# Patient Record
Sex: Male | Born: 1970 | Race: White | Hispanic: No | Marital: Married | Smoking: Never smoker
Health system: Southern US, Community
[De-identification: ages and names within clinical notes are randomized; demographics above are authoritative.]

## PROBLEM LIST (undated history)

## (undated) DIAGNOSIS — R569 Unspecified convulsions: Secondary | ICD-10-CM

## (undated) HISTORY — PX: NO PAST SURGERIES: SHX2092

## (undated) HISTORY — DX: Unspecified convulsions: R56.9

---

## 1999-09-23 ENCOUNTER — Emergency Department (HOSPITAL_COMMUNITY): Admission: EM | Admit: 1999-09-23 | Discharge: 1999-09-23 | Payer: Self-pay | Admitting: Emergency Medicine

## 2012-12-21 ENCOUNTER — Other Ambulatory Visit: Payer: Self-pay | Admitting: Diagnostic Neuroimaging

## 2012-12-30 ENCOUNTER — Other Ambulatory Visit: Payer: Self-pay | Admitting: Diagnostic Neuroimaging

## 2012-12-31 LAB — COMPREHENSIVE METABOLIC PANEL
Albumin: 4.3 g/dL (ref 3.5–5.5)
Alkaline Phosphatase: 82 IU/L (ref 39–117)
BUN/Creatinine Ratio: 20 (ref 9–20)
BUN: 16 mg/dL (ref 6–24)
CO2: 28 mmol/L (ref 19–28)
Calcium: 9.3 mg/dL (ref 8.7–10.2)
Creatinine, Ser: 0.82 mg/dL (ref 0.76–1.27)
Globulin, Total: 2.2 g/dL (ref 1.5–4.5)
Glucose: 117 mg/dL — ABNORMAL HIGH (ref 65–99)
Total Protein: 6.5 g/dL (ref 6.0–8.5)

## 2012-12-31 LAB — CBC WITH DIFFERENTIAL
Basos: 0 % (ref 0–3)
Eosinophils Absolute: 0 10*3/uL (ref 0.0–0.4)
Hemoglobin: 14.8 g/dL (ref 12.6–17.7)
Lymphs: 36 % (ref 14–46)
MCHC: 34.9 g/dL (ref 31.5–35.7)
Neutrophils Relative %: 59 % (ref 40–74)
Platelets: 197 10*3/uL (ref 155–379)
RBC: 5.34 x10E6/uL (ref 4.14–5.80)

## 2013-01-06 ENCOUNTER — Telehealth: Payer: Self-pay | Admitting: *Deleted

## 2013-01-13 NOTE — Telephone Encounter (Signed)
Call pt with normal labs. Mildly elevated sugar noted. -VRP

## 2013-01-14 NOTE — Telephone Encounter (Signed)
Patient is aware of lab results.

## 2013-05-05 ENCOUNTER — Encounter: Payer: Self-pay | Admitting: Diagnostic Neuroimaging

## 2013-05-17 ENCOUNTER — Encounter: Payer: Self-pay | Admitting: Physician Assistant

## 2013-06-21 ENCOUNTER — Ambulatory Visit: Payer: Self-pay | Admitting: Diagnostic Neuroimaging

## 2013-07-27 ENCOUNTER — Ambulatory Visit (INDEPENDENT_AMBULATORY_CARE_PROVIDER_SITE_OTHER): Payer: 59 | Admitting: Diagnostic Neuroimaging

## 2013-07-27 ENCOUNTER — Encounter (INDEPENDENT_AMBULATORY_CARE_PROVIDER_SITE_OTHER): Payer: Self-pay

## 2013-07-27 ENCOUNTER — Encounter: Payer: Self-pay | Admitting: Diagnostic Neuroimaging

## 2013-07-27 VITALS — BP 132/90 | HR 91 | Temp 97.7°F | Ht 70.0 in | Wt 233.0 lb

## 2013-07-27 DIAGNOSIS — G40B09 Juvenile myoclonic epilepsy, not intractable, without status epilepticus: Secondary | ICD-10-CM

## 2013-07-27 DIAGNOSIS — G40309 Generalized idiopathic epilepsy and epileptic syndromes, not intractable, without status epilepticus: Secondary | ICD-10-CM

## 2013-07-27 MED ORDER — LEVETIRACETAM ER 500 MG PO TB24: 1000.0000 mg | ORAL_TABLET | Freq: Every day | ORAL | Status: DC

## 2013-07-27 NOTE — Patient Instructions (Signed)
Start levetiracetam 1000mg  at bedtime and continue Stavzor 1000mg  in the morning.  After 1 month, start tapering stavzor (reduce by 250mg  per day, every month):  750mg  daily x 1 month  500mg  daily x 1 month  250mg  daily x 1 month  Then stop stavzor.  Avoid driving during seizure medication transition.

## 2013-07-27 NOTE — Progress Notes (Signed)
[facility]  PATIENT: Steven Lamb DOB: [birth year]  REFERRING CLINICIAN:  HISTORY FROM: patient  REASON FOR VISIT: follow up   HISTORICAL  CHIEF COMPLAINT:  Chief Complaint  Patient presents with  . Follow-up    epilepsy    HISTORY OF PRESENT ILLNESS:   UPDATE 07/27/13: Since last visit patient has continued on stavzor and is doing well. No side effects. No seizures. He declined taking to get a good year as we discussed last time, because he felt he would rather pay extra money for medication and was doing well for him. Today patient is asking questions about levetiracetam, and wonders if this medication to work for him.  UPDATE 07/09/12: Doing well. No seizure since 2000 (when he was off meds). Now stavzor, and like the gel capsule. However co-pay assistance coupon has expired (now paying $55 per month). Looking for other options.   PRIOR HPI: 42 year old white male returns for followup. He was last seen in this office in November 2009 by Dr. Orlin Hilding. 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 He has difficulty following up on an annual basis. EEG in the past showed that he is generalized spike and wave activity in 1995. He has been maintained on Depakote and switched to [facility] when last seen by Dr. Orlin Hilding. He also has a history of migraines which has been well controlled. He has a history of minor fluctuations in his liver function but subsequent studies have been normal. He did not have his labs done after his last visit as requested. Last seizure event occurred in 2000, denies difficulty tolerating the medication. Discussion  about stopping the medication but the patient has elected to stay on it. No new neurologic symptoms.  REVIEW OF SYSTEMS: Full 14 system review of systems performed and notable only for as per HPI, otherwise negative.  ALLERGIES: No Known Allergies  HOME  MEDICATIONS: Outpatient Prescriptions Prior to Visit  Medication Sig Dispense Refill  . divalproex (DEPAKOTE ER) 500 MG 24 hr tablet Take 500 mg by mouth daily.      . Valproic Acid (STAVZOR) 500 MG CPDR Take 2 capsules (1,000 mg total) by mouth daily.  60 capsule  6   No facility-administered medications prior to visit.    PAST MEDICAL HISTORY: Past Medical History  Diagnosis Date  . Seizures     PAST SURGICAL HISTORY: History reviewed. No pertinent past surgical history.  FAMILY HISTORY: Family History  Problem Relation Age of Onset  . Heart disease Father   . Epilepsy Other     SOCIAL HISTORY:  History   Social History  . Marital Status: Married    Spouse Name: Boneta Lucks    Number of Children: 2  . Years of Education: masters   Occupational History  . Architectural technologist    Social History Main Topics  . Smoking status: Never Smoker   . Smokeless tobacco: Never Used  . Alcohol Use: No  . Drug Use: No  . Sexual Activity: Not on file   Other Topics Concern  . Not on file   Social History Narrative   Patient lives at home with family.   Caffeine Use: 1-2 cups of coffee; 12-20oz of soda daily     PHYSICAL EXAM  Filed Vitals:   07/27/13 0833  BP: 132/90  Pulse: 91  Temp: 97.7 F (36.5 C)  TempSrc: Oral  Height: 5\' 10"  (1.778 m)  Weight: 233 lb (  105.688 kg)    Not recorded    Body mass index is 33.43 kg/(m^2).  GENERAL EXAM: Patient is in no distress; well developed, nourished and groomed; neck is supple  CARDIOVASCULAR: Regular rate and rhythm, no murmurs, no carotid bruits  NEUROLOGIC: MENTAL STATUS: awake, alert, oriented to person, place and time, recent and remote memory intact, normal attention and concentration, language fluent, comprehension intact, naming intact, fund of knowledge appropriate CRANIAL NERVE: no papilledema on fundoscopic exam, pupils equal and reactive to light, visual fields full to confrontation, extraocular muscles  intact, no nystagmus, facial sensation and strength symmetric, hearing intact, palate elevates symmetrically, uvula midline, shoulder shrug symmetric, tongue midline. MOTOR: normal bulk and tone, full strength in the BUE, BLE SENSORY: normal and symmetric to light touch COORDINATION: finger-nose-finger, fine finger movements normal GAIT/STATION: narrow based gait; romberg is negative    DIAGNOSTIC DATA (LABS, IMAGING, TESTING) - I reviewed patient records, labs, notes, testing and imaging myself where available.  Lab Results  Component Value Date   WBC 7.7 12/30/2012   HGB 14.8 12/30/2012   HCT 42.4 12/30/2012   MCV 79 12/30/2012   PLT 197 12/30/2012      Component Value Date/Time   NA 139 12/30/2012 1424   K 4.1 12/30/2012 1424   CL 103 12/30/2012 1424   CO2 28 12/30/2012 1424   GLUCOSE 117* 12/30/2012 1424   BUN 16 12/30/2012 1424   CREATININE 0.82 12/30/2012 1424   CALCIUM 9.3 12/30/2012 1424   PROT 6.5 12/30/2012 1424   AST 21 12/30/2012 1424   ALT 27 12/30/2012 1424   ALKPHOS 82 12/30/2012 1424   BILITOT 0.4 12/30/2012 1424   GFRNONAA 111 12/30/2012 1424   GFRAA 128 12/30/2012 1424   No results found for this basename: CHOL, HDL, LDLCALC, LDLDIRECT, TRIG, CHOLHDL   No results found for this basename: HGBA1C   No results found for this basename: VITAMINB12   No results found for this basename: TSH     ASSESSMENT AND PLAN  42 y.o. year old male here with Juvenile myoclonic epilepsy since 1995, doing well. Patient requests transition to new generation antiseizure medication with less side effects and better cost than his brand name Stavzor.  PLAN: 1. Start levetiracetam 1000mg  at bedtime and continue Stavzor 1000mg  in the morning.  2. After 1 month, start tapering stavzor (reduce by 250mg  per day, every month):  750mg  daily x 1 month  500mg  daily x 1 month  250mg  daily x 1 month  Then stop stavzor.  3. Avoid driving during seizure medication transition.  Meds ordered  this encounter  Medications  . Valproic Acid (STAVZOR) 250 MG CPDR    Sig: Take 1,000 mg by mouth daily. 4 capsules daily  . levETIRAcetam (KEPPRA XR) 500 MG 24 hr tablet    Sig: Take 2 tablets (1,000 mg total) by mouth at bedtime.    Dispense:  60 tablet    Refill:  6   Return in about 2 months (around 09/27/2013).   Suanne Marker, MD 07/27/2013, 9:08 AM Certified in Neurology, Neurophysiology and Neuroimaging  [facility] [address] [place], [place] [address] [phone]

## 2013-08-02 ENCOUNTER — Telehealth: Payer: Self-pay

## 2013-08-02 NOTE — Telephone Encounter (Signed)
[facility] sent [place] a fax saying Steven Lamb is currently on Backorder, with no release date.  They do not know when they will be able to get this medication back in stock.  I see in the chart notes the patient will be tapering this medication, however, in the meantime they are requesting a drug change.  Please advise.  Thank you.

## 2013-08-30 ENCOUNTER — Telehealth: Payer: Self-pay | Admitting: Diagnostic Neuroimaging

## 2013-08-30 NOTE — Telephone Encounter (Signed)
Error

## 2013-08-30 NOTE — Telephone Encounter (Signed)
The patient can be reached at [phone].

## 2013-08-30 NOTE — Telephone Encounter (Signed)
Patient calling stating that his pharmacy has told him that Nicholas Lose has been discontinued and is wondering what the next step is.

## 2013-09-02 NOTE — Telephone Encounter (Signed)
I called patient at both numbers, no answer. I called pharmacy and called in the change:   Will switch stavzor to divalproex 250mg  tabs (take 4 days daily and reduce according to tapering schedule). Plan to continue levetriacetam and taper divalproex off. See last progress note.   Penni Bombard, MD 4/66/5993, 57:01 AM Certified in Neurology, Neurophysiology and Neuroimaging  [facility] [address], [place], [place] [address] [phone]

## 2013-09-03 ENCOUNTER — Encounter: Payer: Self-pay | Admitting: Diagnostic Neuroimaging

## 2013-10-13 ENCOUNTER — Ambulatory Visit: Payer: 59 | Admitting: Diagnostic Neuroimaging

## 2013-12-03 ENCOUNTER — Ambulatory Visit (INDEPENDENT_AMBULATORY_CARE_PROVIDER_SITE_OTHER): Payer: 59 | Admitting: Diagnostic Neuroimaging

## 2013-12-03 ENCOUNTER — Encounter: Payer: Self-pay | Admitting: Diagnostic Neuroimaging

## 2013-12-03 VITALS — BP 132/88 | HR 79 | Ht 70.0 in | Wt 239.0 lb

## 2013-12-03 DIAGNOSIS — G40309 Generalized idiopathic epilepsy and epileptic syndromes, not intractable, without status epilepticus: Secondary | ICD-10-CM

## 2013-12-03 DIAGNOSIS — G40B09 Juvenile myoclonic epilepsy, not intractable, without status epilepticus: Secondary | ICD-10-CM

## 2013-12-03 MED ORDER — LEVETIRACETAM ER 500 MG PO TB24: 1000.0000 mg | ORAL_TABLET | Freq: Every day | ORAL | Status: DC

## 2013-12-03 NOTE — Patient Instructions (Signed)
Continue levetiracetam XR 1000mg  at bedtime.  Continue tapering depakote off as per plan.

## 2013-12-03 NOTE — Progress Notes (Signed)
[facility]  PATIENT: Steven Lamb DOB: September 05, 1970  REFERRING CLINICIAN:  HISTORY FROM: patient  REASON FOR VISIT: follow up   HISTORICAL  CHIEF COMPLAINT:  Chief Complaint  Patient presents with  . Follow-up    jme    HISTORY OF PRESENT ILLNESS:   UPDATE 12/03/13: Since last visit, no seizures. Has started LEV XR 1000mg  qhs, and then stavzor switched to depakote (pharmacy stopped carrying it). Initially had sig weight gain and mood swings. Now as he has reduced depakote, his mood and weight have improved.  UPDATE 07/27/13: Since last visit patient has continued on stavzor and is doing well. No side effects. No seizures. He declined taking to get a good year as we discussed last time, because he felt he would rather pay extra money for medication and was doing well for him. Today patient is asking questions about levetiracetam, and wonders if this medication to work for him.  UPDATE 07/09/12: Doing well. No seizure since 2000 (when he was off meds). Now stavzor, and like the gel capsule. However co-pay assistance coupon has expired (now paying $55 per month). Looking for other options.   PRIOR HPI: 43 year old white male returns for followup. He was last seen in this office in November 2009 by Dr. Jacolyn Reedy. 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 He has difficulty following up on an annual basis. EEG in the past showed that he is generalized spike and wave activity in 1995. He has been maintained on Depakote and switched to [facility] when last seen by Dr. Jacolyn Reedy. He also has a history of migraines which has been well controlled. He has a history of minor fluctuations in his liver function but subsequent studies have been normal. He did not have his labs done after his last visit as requested. Last seizure event occurred in 2000, denies difficulty tolerating the medication. Discussion  about stopping the  medication but the patient has elected to stay on it. No new neurologic symptoms.  REVIEW OF SYSTEMS: Full 14 system review of systems performed and notable only for as per HPI, otherwise negative.  ALLERGIES: No Known Allergies  HOME MEDICATIONS: Outpatient Prescriptions Prior to Visit  Medication Sig Dispense Refill  . levETIRAcetam (KEPPRA XR) 500 MG 24 hr tablet Take 2 tablets (1,000 mg total) by mouth at bedtime.  60 tablet  6  . Valproic Acid (STAVZOR) 250 MG CPDR Take 250 mg by mouth daily.        No facility-administered medications prior to visit.    PAST MEDICAL HISTORY: Past Medical History  Diagnosis Date  . Seizures     PAST SURGICAL HISTORY: History reviewed. No pertinent past surgical history.  FAMILY HISTORY: Family History  Problem Relation Age of Onset  . Heart disease Father   . Epilepsy Other     SOCIAL HISTORY:  History   Social History  . Marital Status: Married    Spouse Name: [name]    Number of Children: 2  . Years of Education: masters   Occupational History  . Advertising copywriter    Social History Main Topics  . Smoking status: Never Smoker   . Smokeless tobacco: Never Used  . Alcohol Use: No  . Drug Use: No  . Sexual Activity: Not on file   Other Topics Concern  . Not on file   Social History Narrative   Patient lives at home with family.   Caffeine Use:  1-2 cups of coffee; 12-20oz of soda daily     PHYSICAL EXAM  Filed Vitals:   12/03/13 0838  BP: 132/88  Pulse: 79  Height: 5\' 10"  (1.778 m)  Weight: 239 lb (108.41 kg)    Not recorded    Body mass index is 34.29 kg/(m^2).  GENERAL EXAM: Patient is in no distress; well developed, nourished and groomed; neck is supple  CARDIOVASCULAR: Regular rate and rhythm, no murmurs, no carotid bruits  NEUROLOGIC: MENTAL STATUS: awake, alert, oriented to person, place and time, recent and remote memory intact, normal attention and concentration, language fluent,  comprehension intact, naming intact, fund of knowledge appropriate CRANIAL NERVE: no papilledema on fundoscopic exam, pupils equal and reactive to light, visual fields full to confrontation, extraocular muscles intact, no nystagmus, facial sensation and strength symmetric, hearing intact, palate elevates symmetrically, uvula midline, shoulder shrug symmetric, tongue midline. MOTOR: normal bulk and tone, full strength in the BUE, BLE SENSORY: normal and symmetric to light touch COORDINATION: finger-nose-finger, fine finger movements normal GAIT/STATION: narrow based gait; romberg is negative; tandem stable    DIAGNOSTIC DATA (LABS, IMAGING, TESTING) - I reviewed patient records, labs, notes, testing and imaging myself where available.  Lab Results  Component Value Date   WBC 7.7 12/30/2012   HGB 14.8 12/30/2012   HCT 42.4 12/30/2012   MCV 79 12/30/2012   PLT 197 12/30/2012      Component Value Date/Time   NA 139 12/30/2012 1424   K 4.1 12/30/2012 1424   CL 103 12/30/2012 1424   CO2 28 12/30/2012 1424   GLUCOSE 117* 12/30/2012 1424   BUN 16 12/30/2012 1424   CREATININE 0.82 12/30/2012 1424   CALCIUM 9.3 12/30/2012 1424   PROT 6.5 12/30/2012 1424   AST 21 12/30/2012 1424   ALT 27 12/30/2012 1424   ALKPHOS 82 12/30/2012 1424   BILITOT 0.4 12/30/2012 1424   GFRNONAA 111 12/30/2012 1424   GFRAA 128 12/30/2012 1424   No results found for this basename: CHOL,  HDL,  LDLCALC,  LDLDIRECT,  TRIG,  CHOLHDL   No results found for this basename: HGBA1C   No results found for this basename: VITAMINB12   No results found for this basename: TSH     ASSESSMENT AND PLAN  43 y.o. year old male here with Juvenile myoclonic epilepsy since 1995, doing well. Patient transitioning to new generation antiseizure medication with less side effects and better cost than his prior brand name Stavzor.  PLAN: 1. Continue levetiracetam XR 1000mg  at bedtime 2. Continue depakote 250mg  daily x 1 month, then stop 3.  After off depakote x 1 month, may resume driving  Return in about 6 months (around 06/04/2014).   Penni Bombard, MD 04/01/4817, 5:63 AM Certified in Neurology, Neurophysiology and Neuroimaging  [facility] [address] [place], [place] [address] [phone]

## 2014-06-06 ENCOUNTER — Encounter: Payer: Self-pay | Admitting: Diagnostic Neuroimaging

## 2014-06-06 ENCOUNTER — Ambulatory Visit (INDEPENDENT_AMBULATORY_CARE_PROVIDER_SITE_OTHER): Payer: 59 | Admitting: Diagnostic Neuroimaging

## 2014-06-06 VITALS — BP 136/88 | HR 90 | Temp 97.2°F | Ht 70.0 in | Wt 231.4 lb

## 2014-06-06 DIAGNOSIS — G40B09 Juvenile myoclonic epilepsy, not intractable, without status epilepticus: Secondary | ICD-10-CM

## 2014-06-06 MED ORDER — LEVETIRACETAM ER 500 MG PO TB24: 1000.0000 mg | ORAL_TABLET | Freq: Every day | ORAL | Status: DC

## 2014-06-06 NOTE — Patient Instructions (Signed)
Continue levetiracetem XR 1000mg  daily.
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
## 2014-11-09 ENCOUNTER — Other Ambulatory Visit: Payer: Self-pay | Admitting: Diagnostic Neuroimaging

## 2015-06-06 DIAGNOSIS — Z0289 Encounter for other administrative examinations: Secondary | ICD-10-CM

## 2015-06-07 ENCOUNTER — Ambulatory Visit: Payer: 59 | Admitting: Diagnostic Neuroimaging

## 2015-06-13 ENCOUNTER — Encounter: Payer: Self-pay | Admitting: Diagnostic Neuroimaging

## 2015-06-13 ENCOUNTER — Ambulatory Visit (INDEPENDENT_AMBULATORY_CARE_PROVIDER_SITE_OTHER): Payer: 59 | Admitting: Diagnostic Neuroimaging

## 2015-06-13 VITALS — BP 138/80 | HR 78 | Ht 70.0 in | Wt 225.4 lb

## 2015-06-13 DIAGNOSIS — G40B09 Juvenile myoclonic epilepsy, not intractable, without status epilepticus: Secondary | ICD-10-CM | POA: Diagnosis not present

## 2015-06-13 MED ORDER — LEVETIRACETAM ER 500 MG PO TB24
1000.0000 mg | ORAL_TABLET | Freq: Every day | ORAL | Status: DC
Start: 2015-06-13 — End: 2016-02-14

## 2015-06-13 NOTE — Patient Instructions (Signed)
Thank you for coming to see [place] at [facility]. I hope we have been able to provide you high quality care today.  You may receive a patient satisfaction survey over the next few weeks. We would appreciate your feedback and comments so that we may continue to improve ourselves and the health of our patients.  - continue current levetiracetam XR 1042m at bedtime   ~~~~~~~~~~~~~~~~~~~~~~~~~~~~~~~~~~~~~~~~~~~~~~~~~~~~~~~~~~~~~~~~~  DR. PENUMALLI'S GUIDE TO HAPPY AND HEALTHY LIVING These are some of my general health and wellness recommendations. Some of them may apply to you better than others. Please use common sense as you try these suggestions and feel free to ask me any questions.   ACTIVITY/FITNESS Mental, social, emotional and physical stimulation are very important for brain and body health. Try learning a new activity (arts, music, language, sports, games).  Keep moving your body to the best of your abilities. You can do this at home, inside or outside, the park, community center, gym or anywhere you like. Consider a physical therapist or personal trainer to get started. Consider the app Sworkit. Fitness trackers such as smart-watches, smart-phones or Fitbits can help as well.   NUTRITION Eat more plants: colorful vegetables, nuts, seeds and berries.  Eat less sugar, salt, preservatives and processed foods.  Avoid toxins such as cigarettes and alcohol.  Drink water when you are thirsty. Warm water with a slice of lemon is an excellent morning drink to start the day.  Consider these websites for more information [employer] (hhttps://www.henry-hernandez.biz/ [employer] (wWindowBlog.ch   RELAXATION Consider practicing mindfulness meditation or other relaxation techniques such as deep breathing, prayer, yoga, tai chi, massage. See website mindful.org or the apps Headspace or Calm to help get  started.   SLEEP Try to get at least 7-8+ hours sleep per day. Regular exercise and reduced caffeine will help you sleep better. Practice good sleep hygeine techniques. See website sleep.org for more information.   PLANNING Prepare estate planning, living will, healthcare POA documents. Sometimes this is best planned with the help of an attorney. Theconversationproject.org and agingwithdignity.org are excellent resources.
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
## 2016-01-08 DIAGNOSIS — J301 Allergic rhinitis due to pollen: Secondary | ICD-10-CM | POA: Diagnosis not present

## 2016-01-08 DIAGNOSIS — Z1389 Encounter for screening for other disorder: Secondary | ICD-10-CM | POA: Diagnosis not present

## 2016-01-08 DIAGNOSIS — Z6833 Body mass index (BMI) 33.0-33.9, adult: Secondary | ICD-10-CM | POA: Diagnosis not present

## 2016-02-12 ENCOUNTER — Telehealth: Payer: Self-pay | Admitting: Diagnostic Neuroimaging

## 2016-02-12 NOTE — Telephone Encounter (Signed)
Yes come in for appt. -VRP

## 2016-02-12 NOTE — Telephone Encounter (Signed)
Pt's wife called in stating he had a seizure on Saturday. It lasted about 3 minutes. This is the first seizure in 17 yrs. EMS was called to the house and counted pts pills and saw he had missed several dosages of his medication. Wife called to let the physician. Does he need to come in for appt? Please call and advise [phone] or [phone]

## 2016-02-12 NOTE — Telephone Encounter (Signed)
Attempted to schedule pt for FU Wed at 3:30 at his request due to recent seizure. Karen Kays, will schedule tomorrow and call him back.

## 2016-02-13 NOTE — Telephone Encounter (Signed)
Spoke with wife and informed her that her husband's appointment is scheduled for tomorrow, requested he arrive 15 min early and bring new insurance card if applicable. She verbalized understanding, appreciation.

## 2016-02-14 ENCOUNTER — Ambulatory Visit (INDEPENDENT_AMBULATORY_CARE_PROVIDER_SITE_OTHER): Payer: BLUE CROSS/BLUE SHIELD | Admitting: Diagnostic Neuroimaging

## 2016-02-14 ENCOUNTER — Encounter: Payer: Self-pay | Admitting: Diagnostic Neuroimaging

## 2016-02-14 VITALS — BP 141/98 | HR 91 | Ht 70.0 in | Wt 235.0 lb

## 2016-02-14 DIAGNOSIS — G40B09 Juvenile myoclonic epilepsy, not intractable, without status epilepticus: Secondary | ICD-10-CM | POA: Diagnosis not present

## 2016-02-14 MED ORDER — LEVETIRACETAM ER 500 MG PO TB24: 1500.0000 mg | ORAL_TABLET | Freq: Every day | ORAL | Status: DC

## 2016-02-14 NOTE — Patient Instructions (Signed)
Thank you for coming to see [place] at [facility]. I hope we have been able to provide you high quality care today.  You may receive a patient satisfaction survey over the next few weeks. We would appreciate your feedback and comments so that we may continue to improve ourselves and the health of our patients.  - increase levetiracetam XR to 1510m at bedtime - try to improve sleep pattern; may consider sleep study - no driving x 1 month seizure free   ~~~~~~~~~~~~~~~~~~~~~~~~~~~~~~~~~~~~~~~~~~~~~~~~~~~~~~~~~~~~~~~~~  DR. Tamica Covell'S GUIDE TO HAPPY AND HEALTHY LIVING These are some of my general health and wellness recommendations. Some of them may apply to you better than others. Please use common sense as you try these suggestions and feel free to ask me any questions.   ACTIVITY/FITNESS Mental, social, emotional and physical stimulation are very important for brain and body health. Try learning a new activity (arts, music, language, sports, games).  Keep moving your body to the best of your abilities. You can do this at home, inside or outside, the park, community center, gym or anywhere you like. Consider a physical therapist or personal trainer to get started. Consider the app Sworkit. Fitness trackers such as smart-watches, smart-phones or Fitbits can help as well.   NUTRITION Eat more plants: colorful vegetables, nuts, seeds and berries.  Eat less sugar, salt, preservatives and processed foods.  Avoid toxins such as cigarettes and alcohol.  Drink water when you are thirsty. Warm water with a slice of lemon is an excellent morning drink to start the day.  Consider these websites for more information The Nutrition Source (hhttps://www.henry-hernandez.biz/ [employer] Nutrition (wWindowBlog.ch   RELAXATION Consider practicing mindfulness meditation or other relaxation techniques such as deep breathing, prayer, yoga, tai  chi, massage. See website mindful.org or the apps Headspace or Calm to help get started.   SLEEP Try to get at least 7-8+ hours sleep per day. Regular exercise and reduced caffeine will help you sleep better. Practice good sleep hygeine techniques. See website sleep.org for more information.   PLANNING Prepare estate planning, living will, healthcare POA documents. Sometimes this is best planned with the help of an attorney. Theconversationproject.org and agingwithdignity.org are excellent resources.
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
## 2016-03-20 DIAGNOSIS — D1801 Hemangioma of skin and subcutaneous tissue: Secondary | ICD-10-CM | POA: Diagnosis not present

## 2016-03-20 DIAGNOSIS — L814 Other melanin hyperpigmentation: Secondary | ICD-10-CM | POA: Diagnosis not present

## 2016-03-20 DIAGNOSIS — L821 Other seborrheic keratosis: Secondary | ICD-10-CM | POA: Diagnosis not present

## 2016-03-20 DIAGNOSIS — L570 Actinic keratosis: Secondary | ICD-10-CM | POA: Diagnosis not present

## 2016-03-20 DIAGNOSIS — D235 Other benign neoplasm of skin of trunk: Secondary | ICD-10-CM | POA: Diagnosis not present

## 2016-05-20 ENCOUNTER — Encounter: Payer: Self-pay | Admitting: Diagnostic Neuroimaging

## 2016-05-20 ENCOUNTER — Ambulatory Visit (INDEPENDENT_AMBULATORY_CARE_PROVIDER_SITE_OTHER): Payer: BLUE CROSS/BLUE SHIELD | Admitting: Diagnostic Neuroimaging

## 2016-05-20 VITALS — BP 148/96 | HR 87 | Ht 70.0 in | Wt 235.0 lb

## 2016-05-20 DIAGNOSIS — G40B09 Juvenile myoclonic epilepsy, not intractable, without status epilepticus: Secondary | ICD-10-CM

## 2016-05-20 MED ORDER — LEVETIRACETAM ER 500 MG PO TB24: 1500.0000 mg | ORAL_TABLET | Freq: Every day | ORAL | 4 refills | Status: DC

## 2016-05-20 NOTE — Patient Instructions (Signed)
-   continue levetiracetam XR 1500mg  at bedtime
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
## 2016-06-11 ENCOUNTER — Ambulatory Visit: Payer: 59 | Admitting: Diagnostic Neuroimaging

## 2016-07-10 DIAGNOSIS — Z1389 Encounter for screening for other disorder: Secondary | ICD-10-CM | POA: Diagnosis not present

## 2016-07-10 DIAGNOSIS — Z6834 Body mass index (BMI) 34.0-34.9, adult: Secondary | ICD-10-CM | POA: Diagnosis not present

## 2016-07-10 DIAGNOSIS — J0100 Acute maxillary sinusitis, unspecified: Secondary | ICD-10-CM | POA: Diagnosis not present

## 2016-10-10 DIAGNOSIS — B349 Viral infection, unspecified: Secondary | ICD-10-CM | POA: Diagnosis not present

## 2016-10-10 DIAGNOSIS — J209 Acute bronchitis, unspecified: Secondary | ICD-10-CM | POA: Diagnosis not present

## 2016-10-10 DIAGNOSIS — J069 Acute upper respiratory infection, unspecified: Secondary | ICD-10-CM | POA: Diagnosis not present

## 2016-10-10 DIAGNOSIS — Z1389 Encounter for screening for other disorder: Secondary | ICD-10-CM | POA: Diagnosis not present

## 2016-10-10 DIAGNOSIS — R062 Wheezing: Secondary | ICD-10-CM | POA: Diagnosis not present

## 2016-10-10 DIAGNOSIS — R070 Pain in throat: Secondary | ICD-10-CM | POA: Diagnosis not present

## 2016-10-10 DIAGNOSIS — J343 Hypertrophy of nasal turbinates: Secondary | ICD-10-CM | POA: Diagnosis not present

## 2016-10-10 DIAGNOSIS — R6889 Other general symptoms and signs: Secondary | ICD-10-CM | POA: Diagnosis not present

## 2016-11-19 ENCOUNTER — Ambulatory Visit: Payer: BLUE CROSS/BLUE SHIELD | Admitting: Diagnostic Neuroimaging

## 2017-01-01 ENCOUNTER — Ambulatory Visit (INDEPENDENT_AMBULATORY_CARE_PROVIDER_SITE_OTHER): Payer: BLUE CROSS/BLUE SHIELD | Admitting: Diagnostic Neuroimaging

## 2017-01-01 ENCOUNTER — Encounter: Payer: Self-pay | Admitting: Diagnostic Neuroimaging

## 2017-01-01 VITALS — BP 140/92 | HR 75 | Wt 208.0 lb

## 2017-01-01 DIAGNOSIS — G40B09 Juvenile myoclonic epilepsy, not intractable, without status epilepticus: Secondary | ICD-10-CM | POA: Diagnosis not present

## 2017-01-01 MED ORDER — LEVETIRACETAM ER 500 MG PO TB24: 1500.0000 mg | ORAL_TABLET | Freq: Every day | ORAL | 4 refills | Status: DC
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
## 2017-02-21 ENCOUNTER — Other Ambulatory Visit: Payer: Self-pay | Admitting: Diagnostic Neuroimaging

## 2017-03-27 DIAGNOSIS — L821 Other seborrheic keratosis: Secondary | ICD-10-CM | POA: Diagnosis not present

## 2017-03-27 DIAGNOSIS — D225 Melanocytic nevi of trunk: Secondary | ICD-10-CM | POA: Diagnosis not present

## 2017-03-27 DIAGNOSIS — L814 Other melanin hyperpigmentation: Secondary | ICD-10-CM | POA: Diagnosis not present

## 2017-12-25 DIAGNOSIS — Z1389 Encounter for screening for other disorder: Secondary | ICD-10-CM | POA: Diagnosis not present

## 2017-12-25 DIAGNOSIS — Z6827 Body mass index (BMI) 27.0-27.9, adult: Secondary | ICD-10-CM | POA: Diagnosis not present

## 2017-12-25 DIAGNOSIS — E663 Overweight: Secondary | ICD-10-CM | POA: Diagnosis not present

## 2017-12-25 DIAGNOSIS — Z0001 Encounter for general adult medical examination with abnormal findings: Secondary | ICD-10-CM | POA: Diagnosis not present

## 2018-01-05 ENCOUNTER — Encounter: Payer: Self-pay | Admitting: Diagnostic Neuroimaging

## 2018-01-05 ENCOUNTER — Ambulatory Visit: Payer: Self-pay | Admitting: Diagnostic Neuroimaging

## 2018-01-05 ENCOUNTER — Ambulatory Visit: Payer: BLUE CROSS/BLUE SHIELD | Admitting: Diagnostic Neuroimaging

## 2018-01-05 VITALS — BP 135/84 | HR 71 | Ht 70.0 in | Wt 187.4 lb

## 2018-01-05 DIAGNOSIS — G40B09 Juvenile myoclonic epilepsy, not intractable, without status epilepticus: Secondary | ICD-10-CM | POA: Diagnosis not present

## 2018-01-05 MED ORDER — LEVETIRACETAM ER 500 MG PO TB24: 1500.0000 mg | ORAL_TABLET | Freq: Every day | ORAL | 4 refills | Status: DC
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
## 2018-02-24 DIAGNOSIS — R070 Pain in throat: Secondary | ICD-10-CM | POA: Diagnosis not present

## 2018-02-24 DIAGNOSIS — Z1389 Encounter for screening for other disorder: Secondary | ICD-10-CM | POA: Diagnosis not present

## 2018-02-24 DIAGNOSIS — R0982 Postnasal drip: Secondary | ICD-10-CM | POA: Diagnosis not present

## 2018-02-24 DIAGNOSIS — Z6827 Body mass index (BMI) 27.0-27.9, adult: Secondary | ICD-10-CM | POA: Diagnosis not present

## 2018-02-24 DIAGNOSIS — J019 Acute sinusitis, unspecified: Secondary | ICD-10-CM | POA: Diagnosis not present

## 2018-02-24 DIAGNOSIS — R0981 Nasal congestion: Secondary | ICD-10-CM | POA: Diagnosis not present

## 2018-02-24 DIAGNOSIS — J343 Hypertrophy of nasal turbinates: Secondary | ICD-10-CM | POA: Diagnosis not present

## 2018-06-22 DIAGNOSIS — L819 Disorder of pigmentation, unspecified: Secondary | ICD-10-CM | POA: Diagnosis not present

## 2018-06-22 DIAGNOSIS — L821 Other seborrheic keratosis: Secondary | ICD-10-CM | POA: Diagnosis not present

## 2018-06-22 DIAGNOSIS — L814 Other melanin hyperpigmentation: Secondary | ICD-10-CM | POA: Diagnosis not present

## 2018-06-22 DIAGNOSIS — D1801 Hemangioma of skin and subcutaneous tissue: Secondary | ICD-10-CM | POA: Diagnosis not present

## 2019-01-05 ENCOUNTER — Telehealth: Payer: Self-pay

## 2019-01-05 NOTE — Telephone Encounter (Signed)
Unable to get in contact with the patient to convert their office visit with Amy on 01/12/2019 into a doxy.me visit. I left a voicemail asking the patient to return my call. Office number was provided.   If patient calls back please convert their office visit into a doxy.me visit.

## 2019-01-08 NOTE — Telephone Encounter (Signed)
01-08-19 Pt has called and gave verbal consent to file insurance for doxy.me vv email confirmed as:[email or web address]  Pt understands that although there may be some limitations with this type of visit, we will take all precautions to reduce any security or privacy concerns.  Pt understands that this will be treated like an in office visit and we will file with pt's insurance, and there may be a patient responsible charge related to this service. *e-mail sent to pt*

## 2019-01-12 ENCOUNTER — Other Ambulatory Visit: Payer: Self-pay

## 2019-01-12 ENCOUNTER — Ambulatory Visit (INDEPENDENT_AMBULATORY_CARE_PROVIDER_SITE_OTHER): Payer: BLUE CROSS/BLUE SHIELD | Admitting: Family Medicine

## 2019-01-12 ENCOUNTER — Encounter: Payer: Self-pay | Admitting: Family Medicine

## 2019-01-12 ENCOUNTER — Ambulatory Visit: Payer: BLUE CROSS/BLUE SHIELD | Admitting: Adult Health

## 2019-01-12 DIAGNOSIS — G40B09 Juvenile myoclonic epilepsy, not intractable, without status epilepticus: Secondary | ICD-10-CM

## 2019-01-12 MED ORDER — LEVETIRACETAM ER 500 MG PO TB24: 1500.0000 mg | ORAL_TABLET | Freq: Every day | ORAL | 4 refills | Status: DC
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
## 2019-01-12 NOTE — Telephone Encounter (Signed)
Noted! Thank you

## 2019-01-12 NOTE — Progress Notes (Signed)
I reviewed note and agree with plan.   Penni Bombard, MD 7/37/1062, 6:94 PM Certified in Neurology, Neurophysiology and Neuroimaging  [facility] [address] [place], [place] [address] [phone]

## 2019-06-23 DIAGNOSIS — L821 Other seborrheic keratosis: Secondary | ICD-10-CM | POA: Diagnosis not present

## 2019-06-23 DIAGNOSIS — L814 Other melanin hyperpigmentation: Secondary | ICD-10-CM | POA: Diagnosis not present

## 2019-06-23 DIAGNOSIS — L819 Disorder of pigmentation, unspecified: Secondary | ICD-10-CM | POA: Diagnosis not present

## 2019-06-23 DIAGNOSIS — D1801 Hemangioma of skin and subcutaneous tissue: Secondary | ICD-10-CM | POA: Diagnosis not present

## 2019-08-02 ENCOUNTER — Ambulatory Visit
Admission: EM | Admit: 2019-08-02 | Discharge: 2019-08-02 | Disposition: A | Discharge status: Home / Self Care | Payer: BC Managed Care – PPO | Attending: Emergency Medicine | Admitting: Emergency Medicine

## 2019-08-02 ENCOUNTER — Encounter: Payer: Self-pay | Admitting: Emergency Medicine

## 2019-08-02 ENCOUNTER — Other Ambulatory Visit: Payer: Self-pay

## 2019-08-02 DIAGNOSIS — Z0189 Encounter for other specified special examinations: Secondary | ICD-10-CM | POA: Diagnosis not present

## 2019-08-02 DIAGNOSIS — Z20828 Contact with and (suspected) exposure to other viral communicable diseases: Secondary | ICD-10-CM

## 2019-08-02 DIAGNOSIS — Z20822 Contact with and (suspected) exposure to covid-19: Secondary | ICD-10-CM

## 2019-08-02 NOTE — Discharge Instructions (Addendum)
Your COVID test is pending.  You should self quarantine until your test result is back and is negative.   ° °Go to the emergency department if you develop high fever, shortness of breath, severe diarrhea, or other concerning symptoms.   ° °
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
## 2019-08-02 NOTE — ED Provider Notes (Signed)
[facility]    CSN: [id number] Arrival date & time: 08/02/19  1243      History   Chief Complaint Chief Complaint  Patient presents with  . Covid Exposure    HPI Steven Lamb is a 48 y.o. male.   Patient presents with request for a COVID test.  He was exposed to [place] at a work meeting on 07/27/2019; the meeting was behind closed doors but everyone had masks on.  He denies symptoms, including fever, chills, congestion, cough, shortness of breath, vomiting, diarrhea, or other concerns.  No treatments attempted at home.  The history is provided by the patient.    Past Medical History:  Diagnosis Date  . Seizures ([place])    last sz 02/10/16    Patient Active Problem List   Diagnosis Date Noted  . JME (juvenile myoclonic epilepsy) ([place]) 07/27/2013    History reviewed. No pertinent surgical history.     Home Medications    Prior to Admission medications   Medication Sig Start Date End Date Taking? Authorizing Provider  levETIRAcetam (KEPPRA XR) 500 MG 24 hr tablet Take 3 tablets (1,500 mg total) by mouth daily. 01/12/19   Lomax, Amy, NP    Family History Family History  Problem Relation Age of Onset  . Heart disease Father   . Epilepsy Other     Social History Social History   Tobacco Use  . Smoking status: Never Smoker  . Smokeless tobacco: Never Used  Substance Use Topics  . Alcohol use: No  . Drug use: No     Allergies   Patient has no known allergies.   Review of Systems Review of Systems  Constitutional: Negative for chills and fever.  HENT: Negative for congestion, ear pain, rhinorrhea and sore throat.   Eyes: Negative for pain and visual disturbance.  Respiratory: Negative for cough and shortness of breath.   Cardiovascular: Negative for chest pain and palpitations.  Gastrointestinal: Negative for abdominal pain, diarrhea and vomiting.  Genitourinary: Negative for dysuria and hematuria.  Musculoskeletal: Negative for  arthralgias and back pain.  Skin: Negative for color change and rash.  Neurological: Negative for seizures and syncope.  All other systems reviewed and are negative.    Physical Exam Triage Vital Signs ED Triage Vitals [08/02/19 1247]  Enc Vitals Group     BP      Pulse      Resp      Temp      Temp src      SpO2      Weight      Height      Head Circumference      Peak Flow      Pain Score 0     Pain Loc      Pain Edu?      Excl. in [place]?    No data found.  Updated Vital Signs BP (!) 146/89 (BP Location: Left Arm)   Pulse 75   Temp 98.9 F (37.2 C) (Temporal)   Resp 16   SpO2 95%   Visual Acuity Right Eye Distance:   Left Eye Distance:   Bilateral Distance:    Right Eye Near:   Left Eye Near:    Bilateral Near:     Physical Exam Vitals and nursing note reviewed.  Constitutional:      General: He is not in acute distress.    Appearance: He is well-developed. He is not ill-appearing.  HENT:  Head: Normocephalic and atraumatic.     Right Ear: Tympanic membrane normal.     Left Ear: Tympanic membrane normal.     Nose: Nose normal.     Mouth/Throat:     Mouth: Mucous membranes are moist.     Pharynx: Oropharynx is clear.  Eyes:     Conjunctiva/sclera: Conjunctivae normal.  Cardiovascular:     Rate and Rhythm: Normal rate and regular rhythm.     Heart sounds: No murmur.  Pulmonary:     Effort: Pulmonary effort is normal. No respiratory distress.     Breath sounds: Normal breath sounds.  Abdominal:     General: Bowel sounds are normal.     Palpations: Abdomen is soft.     Tenderness: There is no abdominal tenderness. There is no guarding or rebound.  Musculoskeletal:     Cervical back: Neck supple.  Skin:    General: Skin is warm and dry.     Findings: No rash.  Neurological:     General: No focal deficit present.     Mental Status: He is alert and oriented to person, place, and time.  Psychiatric:        Mood and Affect: Mood normal.         Behavior: Behavior normal.      UC Treatments / Results  Labs (all labs ordered are listed, but only abnormal results are displayed) Labs Reviewed  NOVEL CORONAVIRUS, NAA    EKG   Radiology No results found.  Procedures Procedures (including critical care time)  Medications Ordered in UC Medications - No data to display  Initial Impression / Assessment and Plan / UC Course  I have reviewed the triage vital signs and the nursing notes.  Pertinent labs & imaging results that were available during my care of the patient were reviewed by me and considered in my medical decision making (see chart for details).    Patient request for COVID test after exposure.  COVID test performed here.  Instructed patient to self quarantine until the test result is back.  Instructed patient to go to the emergency department if he develops high fever, shortness of breath, severe diarrhea, or other concerning symptoms.  Patient agrees with plan of care.   Final Clinical Impressions(s) / UC Diagnoses   Final diagnoses:  Patient request for diagnostic testing  Exposure to COVID-19 virus     Discharge Instructions     Your COVID test is pending.  You should self quarantine until your test result is back and is negative.    Go to the emergency department if you develop high fever, shortness of breath, severe diarrhea, or other concerning symptoms.       ED Prescriptions    None     PDMP not reviewed this encounter.   Sharion Balloon, NP 08/02/19 1316

## 2019-08-04 LAB — NOVEL CORONAVIRUS, NAA: SARS-CoV-2, NAA: NOT DETECTED

## 2019-08-22 DIAGNOSIS — H65192 Other acute nonsuppurative otitis media, left ear: Secondary | ICD-10-CM | POA: Diagnosis not present

## 2019-11-10 ENCOUNTER — Telehealth: Payer: Self-pay | Admitting: Family Medicine

## 2019-11-10 NOTE — Telephone Encounter (Signed)
I called pt and relayed via VM that we are recommending pts to have COVID vaccine, we see for epilepsy.  (we do not give vaccine preferences).    If additional questions to call back.

## 2019-11-10 NOTE — Telephone Encounter (Signed)
Pt called wanting to know with his health condition if he would be ok to receive the Covid Vaccine. Please advise.

## 2020-02-15 ENCOUNTER — Other Ambulatory Visit: Payer: Self-pay | Admitting: Diagnostic Neuroimaging

## 2020-02-15 MED ORDER — LEVETIRACETAM ER 500 MG PO TB24: 1500.0000 mg | ORAL_TABLET | Freq: Every day | ORAL | 0 refills | Status: DC

## 2020-02-15 NOTE — Addendum Note (Signed)
Addended byUbaldo Glassing, [place] Grayer L on: 02/15/2020 11:10 AM   Modules accepted: Orders

## 2020-02-15 NOTE — Addendum Note (Signed)
Addended by: Brandon Melnick on: 02/15/2020 09:54 AM   Modules accepted: Orders

## 2020-02-15 NOTE — Telephone Encounter (Signed)
Pt called office and scheduled an appt with Amy, NP for 02/17/2020. He is asking for his keppra to be refilled and sent to [facility] on [address] if that is ok.

## 2020-02-17 ENCOUNTER — Other Ambulatory Visit: Payer: Self-pay | Admitting: Family Medicine

## 2020-02-17 ENCOUNTER — Ambulatory Visit: Payer: BC Managed Care – PPO | Admitting: Family Medicine

## 2020-02-17 ENCOUNTER — Encounter: Payer: Self-pay | Admitting: Family Medicine

## 2020-02-17 ENCOUNTER — Other Ambulatory Visit: Payer: Self-pay

## 2020-02-17 VITALS — BP 128/86 | HR 77 | Ht 70.0 in | Wt 205.0 lb

## 2020-02-17 DIAGNOSIS — G40B09 Juvenile myoclonic epilepsy, not intractable, without status epilepticus: Secondary | ICD-10-CM | POA: Diagnosis not present

## 2020-02-17 MED ORDER — LEVETIRACETAM ER 500 MG PO TB24: 1500.0000 mg | ORAL_TABLET | Freq: Every day | ORAL | 4 refills | Status: DC
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
## 2020-02-17 NOTE — Patient Instructions (Addendum)
We will continue levetiracetam 1500mg  every day  Stay well hydrated. Eat a well balanced diet. Stay active  Follow up in 1 year    Seizure, Adult A seizure is a sudden burst of abnormal electrical activity in the brain. Seizures usually last from 30 seconds to 2 minutes. They can cause many different symptoms. Usually, seizures are not harmful unless they last a long time. What are the causes? Common causes of this condition include:  Fever or infection.  Conditions that affect the brain, such as: ? A brain abnormality that you were born with. ? A brain or head injury. ? Bleeding in the brain. ? A tumor. ? Stroke. ? Brain disorders such as autism or cerebral palsy.  Low blood sugar.  Conditions that are passed from parent to child (are inherited).  Problems with substances, such as: ? Having a reaction to a drug or a medicine. ? Suddenly stopping the use of a substance (withdrawal). In some cases, the cause may not be known. A person who has repeated seizures over time without a clear cause has a condition called epilepsy. What increases the risk? You are more likely to get this condition if you have:  A family history of epilepsy.  Had a seizure in the past.  A brain disorder.  A history of head injury, lack of oxygen at birth, or strokes. What are the signs or symptoms? There are many types of seizures. The symptoms vary depending on the type of seizure you have. Examples of symptoms during a seizure include:  Shaking (convulsions).  Stiffness in the body.  Passing out (losing consciousness).  Head nodding.  Staring.  Not responding to sound or touch.  Loss of bladder control and bowel control. Some people have symptoms right before and right after a seizure happens. Symptoms before a seizure may include:  Fear.  Worry (anxiety).  Feeling like you may vomit (nauseous).  Feeling like the room is spinning (vertigo).  Feeling like you saw or heard  something before (dj vu).  Odd tastes or smells.  Changes in how you see. You may see flashing lights or spots. Symptoms after a seizure happens can include:  Confusion.  Sleepiness.  Headache.  Weakness on one side of the body. How is this treated? Most seizures will stop on their own in under 5 minutes. In these cases, no treatment is needed. Seizures that last longer than 5 minutes will usually need treatment. Treatment can include:  Medicines given through an IV tube.  Avoiding things that are known to cause your seizures. These can include medicines that you take for another condition.  Medicines to treat epilepsy.  Surgery to stop the seizures. This may be needed if medicines do not help. Follow these instructions at home: Medicines  Take over-the-counter and prescription medicines only as told by your doctor.  Do not eat or drink anything that may keep your medicine from working, such as alcohol. Activity  Do not do any activities that would be dangerous if you had another seizure, like driving or swimming. Wait until your doctor says it is safe for you to do them.  If you live in the [place] ask your local DMV (department of motor vehicles) when you can drive.  Get plenty of rest. Teaching others Teach friends and family what to do when you have a seizure. They should:  Lay you on the ground.  Protect your head and body.  Loosen any tight clothing around your neck.  Turn  you on your side.  Not hold you down.  Not put anything into your mouth.  Know whether or not you need emergency care.  Stay with you until you are better.  General instructions  Contact your doctor each time you have a seizure.  Avoid anything that gives you seizures.  Keep a seizure diary. Write down: ? What you think caused each seizure. ? What you remember about each seizure.  Keep all follow-up visits as told by your doctor. This is important. Contact a doctor if:  You  have another seizure.  You have seizures more often.  There is any change in what happens during your seizures.  You keep having seizures with treatment.  You have symptoms of being sick or having an infection. Get help right away if:  You have a seizure that: ? Lasts longer than 5 minutes. ? Is different than seizures you had before. ? Makes it harder to breathe. ? Happens after you hurt your head.  You have any of these symptoms after a seizure: ? Not being able to speak. ? Not being able to use a part of your body. ? Confusion. ? A bad headache.  You have two or more seizures in a row.  You do not wake up right after a seizure.  You get hurt during a seizure. These symptoms may be an emergency. Do not wait to see if the symptoms will go away. Get medical help right away. Call your local emergency services (911 in the [place] Do not drive yourself to the hospital. Summary  Seizures usually last from 30 seconds to 2 minutes. Usually, they are not harmful unless they last a long time.  Do not eat or drink anything that may keep your medicine from working, such as alcohol.  Teach friends and family what to do when you have a seizure.  Contact your doctor each time you have a seizure. This information is not intended to replace advice given to you by your health care provider. Make sure you discuss any questions you have with your health care provider. Document Revised: 10/23/2018 Document Reviewed: 10/23/2018 Elsevier Patient Education  [place].

## 2020-02-22 NOTE — Progress Notes (Signed)
I reviewed note and agree with plan.   Penni Bombard, MD 0/10/7953, 83:16 AM Certified in Neurology, Neurophysiology and Neuroimaging  [facility] [address], [place], [place] [address] [phone]

## 2020-06-12 ENCOUNTER — Encounter (HOSPITAL_BASED_OUTPATIENT_CLINIC_OR_DEPARTMENT_OTHER): Payer: Self-pay | Admitting: *Deleted

## 2020-06-12 ENCOUNTER — Emergency Department (HOSPITAL_BASED_OUTPATIENT_CLINIC_OR_DEPARTMENT_OTHER)
Admission: EM | Admit: 2020-06-12 | Discharge: 2020-06-12 | Disposition: A | Discharge status: Home / Self Care | Payer: Worker's Compensation | Attending: Emergency Medicine | Admitting: Emergency Medicine

## 2020-06-12 ENCOUNTER — Other Ambulatory Visit: Payer: Self-pay

## 2020-06-12 ENCOUNTER — Emergency Department (HOSPITAL_BASED_OUTPATIENT_CLINIC_OR_DEPARTMENT_OTHER): Payer: Worker's Compensation

## 2020-06-12 DIAGNOSIS — M542 Cervicalgia: Secondary | ICD-10-CM | POA: Diagnosis not present

## 2020-06-12 DIAGNOSIS — S161XXA Strain of muscle, fascia and tendon at neck level, initial encounter: Secondary | ICD-10-CM | POA: Diagnosis not present

## 2020-06-12 DIAGNOSIS — S139XXA Sprain of joints and ligaments of unspecified parts of neck, initial encounter: Secondary | ICD-10-CM | POA: Insufficient documentation

## 2020-06-12 DIAGNOSIS — I10 Essential (primary) hypertension: Secondary | ICD-10-CM | POA: Diagnosis not present

## 2020-06-12 DIAGNOSIS — S199XXA Unspecified injury of neck, initial encounter: Secondary | ICD-10-CM | POA: Diagnosis not present

## 2020-06-12 DIAGNOSIS — M545 Low back pain, unspecified: Secondary | ICD-10-CM | POA: Diagnosis not present
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
## 2020-06-12 NOTE — ED Notes (Signed)
Patient ambulatory to CT

## 2020-06-12 NOTE — Discharge Instructions (Addendum)
Return to ER if you develop worsening chest pain, belly pain, difficulty breathing or other new concerns.  Take Tylenol Motrin as needed for pain control.
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
## 2020-06-12 NOTE — ED Notes (Signed)
ED Provider at bedside. 
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
## 2020-06-22 DIAGNOSIS — D229 Melanocytic nevi, unspecified: Secondary | ICD-10-CM | POA: Diagnosis not present

## 2020-06-22 DIAGNOSIS — L819 Disorder of pigmentation, unspecified: Secondary | ICD-10-CM | POA: Diagnosis not present

## 2020-06-22 DIAGNOSIS — L821 Other seborrheic keratosis: Secondary | ICD-10-CM | POA: Diagnosis not present

## 2020-06-22 DIAGNOSIS — L814 Other melanin hyperpigmentation: Secondary | ICD-10-CM | POA: Diagnosis not present

## 2021-02-20 ENCOUNTER — Ambulatory Visit: Payer: BC Managed Care – PPO | Admitting: Family Medicine

## 2021-03-01 DIAGNOSIS — H52203 Unspecified astigmatism, bilateral: Secondary | ICD-10-CM | POA: Diagnosis not present

## 2021-03-01 DIAGNOSIS — H5213 Myopia, bilateral: Secondary | ICD-10-CM | POA: Diagnosis not present

## 2021-03-06 ENCOUNTER — Other Ambulatory Visit: Payer: Self-pay

## 2021-03-06 ENCOUNTER — Encounter: Payer: Self-pay | Admitting: Family Medicine

## 2021-03-06 ENCOUNTER — Ambulatory Visit: Payer: BC Managed Care – PPO | Admitting: Family Medicine

## 2021-03-06 VITALS — BP 140/90 | HR 77 | Ht 70.0 in | Wt 214.0 lb

## 2021-03-06 DIAGNOSIS — G40B09 Juvenile myoclonic epilepsy, not intractable, without status epilepticus: Secondary | ICD-10-CM | POA: Diagnosis not present

## 2021-03-06 MED ORDER — LEVETIRACETAM ER 500 MG PO TB24: 1500.0000 mg | ORAL_TABLET | Freq: Every day | ORAL | 4 refills | Status: DC
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
## 2021-03-06 NOTE — Patient Instructions (Signed)
Below is our plan:  We will continue levetiracetam XR 1500mg  daily   Please make sure you are staying well hydrated. I recommend 50-60 ounces daily. Well balanced diet and regular exercise encouraged. Consistent sleep schedule with 6-8 hours recommended.   Please continue follow up with care team as directed.   Follow up with me in 1 year   You may receive a survey regarding today's visit. I encourage you to leave honest feed back as I do use this information to improve patient care. Thank you for seeing me today!

## 2021-09-14 DIAGNOSIS — Z0001 Encounter for general adult medical examination with abnormal findings: Secondary | ICD-10-CM | POA: Diagnosis not present

## 2021-09-14 DIAGNOSIS — E6609 Other obesity due to excess calories: Secondary | ICD-10-CM | POA: Diagnosis not present

## 2021-09-14 DIAGNOSIS — G40309 Generalized idiopathic epilepsy and epileptic syndromes, not intractable, without status epilepticus: Secondary | ICD-10-CM | POA: Diagnosis not present

## 2021-09-14 DIAGNOSIS — Z1331 Encounter for screening for depression: Secondary | ICD-10-CM | POA: Diagnosis not present

## 2021-09-14 DIAGNOSIS — Z6832 Body mass index (BMI) 32.0-32.9, adult: Secondary | ICD-10-CM | POA: Diagnosis not present

## 2021-09-25 ENCOUNTER — Encounter: Payer: Self-pay | Admitting: Internal Medicine

## 2021-11-26 ENCOUNTER — Ambulatory Visit (INDEPENDENT_AMBULATORY_CARE_PROVIDER_SITE_OTHER): Payer: Self-pay | Admitting: *Deleted

## 2021-11-26 VITALS — Ht 70.0 in | Wt 212.0 lb

## 2021-11-26 DIAGNOSIS — Z1211 Encounter for screening for malignant neoplasm of colon: Secondary | ICD-10-CM

## 2021-11-26 NOTE — Progress Notes (Addendum)
Gastroenterology Pre-Procedure Review ? ?Request Date: 11/26/2021 ?Requesting Physician: Delman Cheadle, PA-C, no previous TCS ? ?PATIENT REVIEW QUESTIONS: The patient responded to the following health history questions as indicated:   ? ?1. Diabetes Melitis: no ?2. Joint replacements in the past 12 months: no ?3. Major health problems in the past 3 months: no ?4. Has an artificial valve or MVP: no ?5. Has a defibrillator: no ?6. Has been advised in past to take antibiotics in advance of a procedure like teeth cleaning: no ?7. Family history of colon cancer: no  ?8. Alcohol Use: no ?9. Illicit drug Use: no ?10. History of sleep apnea: no  ?11. History of coronary artery or other vascular stents placed within the last 12 months: no ?12. History of any prior anesthesia complications: no ?13. Body mass index is 30.42 kg/m?. ?   ?MEDICATIONS & ALLERGIES:    ?Patient reports the following regarding taking any blood thinners:   ?Plavix? no ?Aspirin? no ?Coumadin? no ?Brilinta? no ?Xarelto? no ?Eliquis? no ?Pradaxa? no ?Savaysa? no ?Effient? no ? ?Patient confirms/reports the following medications:  ?Current Outpatient Medications  ?Medication Sig Dispense Refill  ? ibuprofen (ADVIL) 200 MG tablet Take 200 mg by mouth as needed. Takes 2 tablets as needed.    ? levETIRAcetam (KEPPRA XR) 500 MG 24 hr tablet Take 3 tablets (1,500 mg total) by mouth daily. 270 tablet 4  ? ?No current facility-administered medications for this visit.  ? ? ?Patient confirms/reports the following allergies:  ?No Known Allergies ? ?No orders of the defined types were placed in this encounter. ? ? ?AUTHORIZATION INFORMATION ?Primary Insurance: [place],  [place] #: [id number],  Group #: [id number] ?Pre-Cert / Josem Kaufmann required: No, file to local [employer] ? ?SCHEDULE INFORMATION: ?Procedure has been scheduled as follows:  ?Date: 01/04/2022, Time: 8:30 ?Location: [facility] with Dr. Abbey Chatters ? ?This Gastroenterology Pre-Precedure Review Form is being routed to the  following provider(s): Roseanne Kaufman, NP ?  ?

## 2021-11-27 NOTE — Progress Notes (Signed)
Lmom for pt to call back. 

## 2021-11-27 NOTE — Progress Notes (Signed)
Appropriate. ASA 2.  

## 2021-11-28 ENCOUNTER — Encounter: Payer: Self-pay | Admitting: *Deleted

## 2021-11-28 MED ORDER — CLENPIQ 10-3.5-12 MG-GM -GM/160ML PO SOLN: 1.0000 | Freq: Once | ORAL | 0 refills | Status: AC

## 2021-11-28 NOTE — Progress Notes (Signed)
Called [employer] at [phone]. Was advised per automated system to fax clinicals to determine if Pre-cert is required. Faxed clinicals to [phone].  Also, inquired about age criteria on paperwork that was faxed. ?

## 2021-11-28 NOTE — Progress Notes (Signed)
Spoke to pt.  He requested me to send RX for Clenpiq to his pharmacy. He wanted the same prep kit as his wife had when she did her procedure.  Sent RX accordingly.  Pt scheduled procedure for 01/04/2022 at 8:30, arrival 7:00 at [facility].  Reviewed prep instructions with pt.  Confirmed mailing address and mailed instructions out to pt. ?

## 2021-11-28 NOTE — Addendum Note (Signed)
Addended by: Metro Kung on: 11/28/2021 09:56 AM ? ? Modules accepted: Orders ? ?

## 2021-11-28 NOTE — Progress Notes (Signed)
Pt called me back to see what dates we had available.  Informed me that he would call me back after discussing with her. ?

## 2021-11-29 NOTE — Progress Notes (Signed)
No Pre-cert required per fax.  Sent to be scanned into Epic.   ? ?Called [employer] to see if there is an age criteria.  No age criteria per Marylyn Ishihara.  REF#: [id number] ?

## 2021-12-13 DIAGNOSIS — J019 Acute sinusitis, unspecified: Secondary | ICD-10-CM | POA: Diagnosis not present

## 2022-01-02 ENCOUNTER — Telehealth: Payer: Self-pay | Admitting: *Deleted

## 2022-01-02 NOTE — Telephone Encounter (Signed)
Pt called in and wanted to know if he was okay to get his permanent crown tomorrow.  Procedure scheduled for Friday 01/04/2022.  Discussed with Neil Crouch, PA-C.  She said it should be fine.  Relayed information to pt. ?

## 2022-01-04 ENCOUNTER — Encounter (HOSPITAL_COMMUNITY): Payer: Self-pay

## 2022-01-04 ENCOUNTER — Ambulatory Visit (HOSPITAL_COMMUNITY): Payer: BC Managed Care – PPO

## 2022-01-04 ENCOUNTER — Encounter (HOSPITAL_COMMUNITY)
Admission: RE | Disposition: A | Discharge status: Home / Self Care | Payer: Self-pay | Source: Home / Self Care | Attending: Internal Medicine

## 2022-01-04 ENCOUNTER — Ambulatory Visit (HOSPITAL_COMMUNITY)
Admission: RE | Admit: 2022-01-04 | Discharge: 2022-01-04 | Disposition: A | Discharge status: Home / Self Care | Payer: BC Managed Care – PPO | Attending: Internal Medicine | Admitting: Internal Medicine

## 2022-01-04 ENCOUNTER — Ambulatory Visit (HOSPITAL_COMMUNITY): Payer: BC Managed Care – PPO | Admitting: Anesthesiology

## 2022-01-04 ENCOUNTER — Other Ambulatory Visit: Payer: Self-pay

## 2022-01-04 DIAGNOSIS — Z139 Encounter for screening, unspecified: Secondary | ICD-10-CM | POA: Diagnosis not present

## 2022-01-04 DIAGNOSIS — G40B09 Juvenile myoclonic epilepsy, not intractable, without status epilepticus: Secondary | ICD-10-CM

## 2022-01-04 DIAGNOSIS — Z1211 Encounter for screening for malignant neoplasm of colon: Secondary | ICD-10-CM | POA: Diagnosis not present

## 2022-01-04 DIAGNOSIS — K5730 Diverticulosis of large intestine without perforation or abscess without bleeding: Secondary | ICD-10-CM | POA: Diagnosis not present

## 2022-01-04 DIAGNOSIS — D125 Benign neoplasm of sigmoid colon: Secondary | ICD-10-CM | POA: Insufficient documentation

## 2022-01-04 DIAGNOSIS — R1012 Left upper quadrant pain: Secondary | ICD-10-CM | POA: Diagnosis not present

## 2022-01-04 DIAGNOSIS — K635 Polyp of colon: Secondary | ICD-10-CM

## 2022-01-04 DIAGNOSIS — D123 Benign neoplasm of transverse colon: Secondary | ICD-10-CM | POA: Diagnosis not present

## 2022-01-04 DIAGNOSIS — K648 Other hemorrhoids: Secondary | ICD-10-CM | POA: Diagnosis not present

## 2022-01-04 DIAGNOSIS — R569 Unspecified convulsions: Secondary | ICD-10-CM | POA: Insufficient documentation

## 2022-01-04 HISTORY — PX: COLONOSCOPY WITH PROPOFOL: SHX5780

## 2022-01-04 HISTORY — PX: POLYPECTOMY: SHX5525

## 2022-01-04 SURGERY — COLONOSCOPY WITH PROPOFOL
Anesthesia: General

## 2022-01-04 MED ORDER — PROPOFOL 10 MG/ML IV BOLUS
INTRAVENOUS | Status: DC | PRN
  Administered 2022-01-04: 100 mg via INTRAVENOUS

## 2022-01-04 MED ORDER — LIDOCAINE 2% (20 MG/ML) 5 ML SYRINGE
INTRAMUSCULAR | Status: DC | PRN
Start: 2022-01-04 — End: 2022-01-04
  Administered 2022-01-04: 50 mg via INTRAVENOUS

## 2022-01-04 MED ORDER — LACTATED RINGERS IV SOLN: INTRAVENOUS | Status: DC

## 2022-01-04 MED ORDER — PROPOFOL 500 MG/50ML IV EMUL
INTRAVENOUS | Status: DC | PRN
  Administered 2022-01-04: 200 ug/kg/min via INTRAVENOUS

## 2022-01-04 NOTE — Anesthesia Preprocedure Evaluation (Addendum)
Anesthesia Evaluation  Patient identified by MRN, date of birth, ID band Patient awake    Reviewed: Allergy & Precautions, NPO status , Patient's Chart, lab work & pertinent test results  Airway Mallampati: III  TM Distance: >3 FB Neck ROM: Full    Dental  (+) Dental Advisory Given, Teeth Intact   Pulmonary neg pulmonary ROS,    breath sounds clear to auscultation       Cardiovascular negative cardio ROS Normal cardiovascular exam Rhythm:Regular Rate:Normal     Neuro/Psych Seizures -, Well Controlled,  negative psych ROS   GI/Hepatic negative GI ROS, Neg liver ROS,   Endo/Other  negative endocrine ROS  Renal/GU negative Renal ROS  negative genitourinary   Musculoskeletal negative musculoskeletal ROS (+)   Abdominal   Peds negative pediatric ROS (+)  Hematology negative hematology ROS (+)   Anesthesia Other Findings Snoring   Reproductive/Obstetrics negative OB ROS                            Anesthesia Physical Anesthesia Plan  ASA: 2  Anesthesia Plan: General   Post-op Pain Management: Minimal or no pain anticipated   Induction: Intravenous  PONV Risk Score and Plan: Propofol infusion  Airway Management Planned: Nasal Cannula and Natural Airway  Additional Equipment:   Intra-op Plan:   Post-operative Plan:   Informed Consent: I have reviewed the patients History and Physical, chart, labs and discussed the procedure including the risks, benefits and alternatives for the proposed anesthesia with the patient or authorized representative who has indicated his/her understanding and acceptance.     Dental advisory given  Plan Discussed with: CRNA and Surgeon  Anesthesia Plan Comments:         Anesthesia Quick Evaluation

## 2022-01-04 NOTE — Discharge Instructions (Addendum)
  Colonoscopy Discharge Instructions  Read the instructions outlined below and refer to this sheet in the next few weeks. These discharge instructions provide you with general information on caring for yourself after you leave the hospital. Your doctor may also give you specific instructions. While your treatment has been planned according to the most current medical practices available, unavoidable complications occasionally occur.   ACTIVITY You may resume your regular activity, but move at a slower pace for the next 24 hours.  Take frequent rest periods for the next 24 hours.  Walking will help get rid of the air and reduce the bloated feeling in your belly (abdomen).  No driving for 24 hours (because of the medicine (anesthesia) used during the test).   Do not sign any important legal documents or operate any machinery for 24 hours (because of the anesthesia used during the test).  NUTRITION Drink plenty of fluids.  You may resume your normal diet as instructed by your doctor.  Begin with a light meal and progress to your normal diet. Heavy or fried foods are harder to digest and may make you feel sick to your stomach (nauseated).  Avoid alcoholic beverages for 24 hours or as instructed.  MEDICATIONS You may resume your normal medications unless your doctor tells you otherwise.  WHAT YOU CAN EXPECT TODAY Some feelings of bloating in the abdomen.  Passage of more gas than usual.  Spotting of blood in your stool or on the toilet paper.  IF YOU HAD POLYPS REMOVED DURING THE COLONOSCOPY: No aspirin products for 7 days or as instructed.  No alcohol for 7 days or as instructed.  Eat a soft diet for the next 24 hours.  FINDING OUT THE RESULTS OF YOUR TEST Not all test results are available during your visit. If your test results are not back during the visit, make an appointment with your caregiver to find out the results. Do not assume everything is normal if you have not heard from your  caregiver or the medical facility. It is important for you to follow up on all of your test results.  SEEK IMMEDIATE MEDICAL ATTENTION IF: You have more than a spotting of blood in your stool.  Your belly is swollen (abdominal distention).  You are nauseated or vomiting.  You have a temperature over 101.  You have abdominal pain or discomfort that is severe or gets worse throughout the day.   Your colonoscopy revealed 3 polyp(s) which I removed successfully. Await pathology results, my office will contact you. I recommend repeating colonoscopy in 5 years for surveillance purposes.   You also have diverticulosis and internal hemorrhoids. I would recommend increasing fiber in your diet or adding OTC Benefiber/Metamucil. Be sure to drink at least 4 to 6 glasses of water daily. Follow-up with GI as needed.   I hope you have a great rest of your week!  Charles K. Carver, D.O. Gastroenterology and Hepatology [facility]  

## 2022-01-04 NOTE — Op Note (Signed)
[facility] Patient Name: Steven Lamb Procedure Date: 01/04/2022 9:00 AM MRN: [id number] Date of Birth: Sep 16, 1970 Attending MD: Elon Alas. Abbey Chatters DO CSN: [id number] Age: 51 Admit Type: Outpatient Procedure:                Colonoscopy Indications:              Screening for colorectal malignant neoplasm Providers:                Elon Alas. Abbey Chatters, DO, Janeece Riggers, RN, Raphael Gibney, Technician Referring MD:              Medicines:                See the Anesthesia note for documentation of the                            administered medications Complications:            No immediate complications. Estimated Blood Loss:     Estimated blood loss was minimal. Procedure:                Pre-Anesthesia Assessment:                           - The anesthesia plan was to use monitored                            anesthesia care (MAC).                           After obtaining informed consent, the colonoscope                            was passed under direct vision. Throughout the                            procedure, the patient's blood pressure, pulse, and                            oxygen saturations were monitored continuously. The                            [id number]-HQ190L ([id number]) scope was introduced through                            the anus and advanced to the the cecum, identified                            by appendiceal orifice and ileocecal valve. The                            colonoscopy was performed without difficulty. The                            patient tolerated the procedure well. The  quality                            of the bowel preparation was evaluated using the                            BBPS [facility] Bowel Preparation Scale) with scores                            of: Right Colon = 3, Transverse Colon = 3 and Left                            Colon = 3 (entire mucosa seen well with no residual                            staining,  small fragments of stool or opaque                            liquid). The total BBPS score equals 9. Scope In: 9:07:52 AM Scope Out: 9:21:23 AM Scope Withdrawal Time: 0 hours 11 minutes 50 seconds  Total Procedure Duration: 0 hours 13 minutes 31 seconds  Findings:      The perianal and digital rectal examinations were normal.      Non-bleeding internal hemorrhoids were found during endoscopy.      A few small-mouthed diverticula were found in the sigmoid colon.      A 8 mm polyp was found in the ascending colon. The polyp was flat. The       polyp was removed with a cold snare. Resection and retrieval were       complete.      A 3 mm polyp was found in the ascending colon. The polyp was sessile.       The polyp was removed with a cold snare. Resection and retrieval were       complete.      A 7 mm polyp was found in the sigmoid colon. The polyp was       semi-pedunculated. The polyp was removed with a cold snare. Resection       and retrieval were complete. Impression:               - Non-bleeding internal hemorrhoids.                           - Diverticulosis in the sigmoid colon.                           - One 8 mm polyp in the ascending colon, removed                            with a cold snare. Resected and retrieved.                           - One 3 mm polyp in the ascending colon, removed                            with  a cold snare. Resected and retrieved.                           - One 7 mm polyp in the sigmoid colon, removed with                            a cold snare. Resected and retrieved. Moderate Sedation:      Per Anesthesia Care Recommendation:           - Patient has a contact number available for                            emergencies. The signs and symptoms of potential                            delayed complications were discussed with the                            patient. Return to normal activities tomorrow.                            Written discharge  instructions were provided to the                            patient.                           - Resume previous diet.                           - Continue present medications.                           - Await pathology results.                           - Repeat colonoscopy in 5 years for surveillance.                           - Return to GI clinic PRN. Procedure Code(s):        --- Professional ---                           [id number], Colonoscopy, flexible; with removal of                            tumor(s), polyp(s), or other lesion(s) by snare                            technique Diagnosis Code(s):        --- Professional ---                           Z12.11, Encounter for screening for malignant  neoplasm of colon                           K64.8, Other hemorrhoids                           K63.5, Polyp of colon                           K57.30, Diverticulosis of large intestine without                            perforation or abscess without bleeding CPT copyright 2019 American Medical Association. All rights reserved. The codes documented in this report are preliminary and upon coder review may  be revised to meet current compliance requirements. Elon Alas. Abbey Chatters, DO [place] Abbey Chatters, DO 01/04/2022 9:23:26 AM This report has been signed electronically. Number of Addenda: 0

## 2022-01-04 NOTE — Transfer of Care (Signed)
Immediate Anesthesia Transfer of Care Note  Patient: Veron Senner Douds  Procedure(s) Performed: COLONOSCOPY WITH PROPOFOL POLYPECTOMY  Patient Location: Endoscopy Unit  Anesthesia Type:MAC  Level of Consciousness: awake, alert , oriented and patient cooperative  Airway & Oxygen Therapy: Patient Spontanous Breathing and Patient connected to nasal cannula oxygen  Post-op Assessment: Report given to RN, Post -op Vital signs reviewed and stable and Patient moving all extremities  Post vital signs: Reviewed and stable  Last Vitals:  Vitals Value Taken Time  BP    Temp    Pulse 95 01/04/22 0925  Resp    SpO2 96 % 01/04/22 0925    Last Pain:  Vitals:   01/04/22 0727  TempSrc: Oral  PainSc: 7       Patients Stated Pain Goal: 9 (82/50/53 9767)  Complications: No notable events documented.

## 2022-01-04 NOTE — Anesthesia Postprocedure Evaluation (Signed)
Anesthesia Post Note  Patient: Steven Lamb  Procedure(s) Performed: COLONOSCOPY WITH PROPOFOL POLYPECTOMY  Patient location during evaluation: Phase II Anesthesia Type: General Level of consciousness: awake and alert and oriented Pain management: pain level controlled Vital Signs Assessment: post-procedure vital signs reviewed and stable Respiratory status: spontaneous breathing, nonlabored ventilation and respiratory function stable Cardiovascular status: blood pressure returned to baseline and stable Postop Assessment: no apparent nausea or vomiting Anesthetic complications: no   No notable events documented.   Last Vitals:  Vitals:   01/04/22 0733 01/04/22 0925  BP: (!) 169/104 (!) 156/95  Pulse:  95  Resp:  (!) 23  Temp:  36.7 C  SpO2:  96%    Last Pain:  Vitals:   01/04/22 0925  TempSrc: Oral  PainSc: 0-No pain                 Leo Weyandt C Lurdes Haltiwanger

## 2022-01-04 NOTE — H&P (Signed)
Primary Care Physician:  Scherrie Bateman Primary Gastroenterologist:  Dr. Abbey Chatters  Pre-Procedure History & Physical: HPI:  Steven Lamb is a 51 y.o. male is here for first ever colonoscopy for colon cancer screening purposes.  Patient denies any family history of colorectal cancer.  No melena or hematochezia.  No abdominal pain or unintentional weight loss.  No change in bowel habits.  Overall feels well from a GI standpoint.  Past Medical History:  Diagnosis Date   Seizures ([place])    last sz 02/10/16    Past Surgical History:  Procedure Laterality Date   NO PAST SURGERIES      Prior to Admission medications   Medication Sig Start Date End Date Taking? Authorizing Provider  aspirin-acetaminophen-caffeine (EXCEDRIN MIGRAINE) [phone] MG tablet Take 1 tablet by mouth 2 (two) times daily as needed for headache.   Yes [provider]  fluticasone (FLONASE) 50 MCG/ACT nasal spray Place 1 spray into both nostrils daily as needed for allergies or rhinitis.   Yes [provider]  ibuprofen (ADVIL) 200 MG tablet Take 800 mg by mouth every 8 (eight) hours as needed (pain.).   Yes [provider]  levETIRAcetam (KEPPRA XR) 500 MG 24 hr tablet Take 3 tablets (1,500 mg total) by mouth daily. 03/06/21  Yes Lomax, Amy, NP    Allergies as of 11/28/2021   (No Known Allergies)    Family History  Problem Relation Age of Onset   Heart disease Father    Epilepsy Other    Colon cancer Neg Hx     Social History   Socioeconomic History   Marital status: Married    Spouse name: [name]   Number of children: 2   Years of education: masters   Highest education level: Not on file  Occupational History   Occupation: Advertising copywriter  Tobacco Use   Smoking status: Never   Smokeless tobacco: Never  Vaping Use   Vaping Use: Never used  Substance and Sexual Activity   Alcohol use: No   Drug use: No   Sexual activity: Not on file  Other Topics  Concern   Not on file  Social History Narrative   Patient lives at home with family.   Caffeine Use: 1-2 cups of coffee; 12-20oz of soda daily   Social Determinants of Health   Financial Resource Strain: Not on file  Food Insecurity: Not on file  Transportation Needs: Not on file  Physical Activity: Not on file  Stress: Not on file  Social Connections: Not on file  Intimate Partner Violence: Not on file    Review of Systems: See HPI, otherwise negative ROS  Physical Exam: Vital signs in last 24 hours: Temp:  [99.1 F (37.3 C)] 99.1 F (37.3 C) (05/19 0727) Pulse Rate:  [118] 118 (05/19 0727) Resp:  [17] 17 (05/19 0727) BP: (169)/(104) 169/104 (05/19 0733) Weight:  [96.2 kg] 96.2 kg (05/19 0727)   General:   Alert,  Well-developed, well-nourished, pleasant and cooperative in NAD Head:  Normocephalic and atraumatic. Eyes:  Sclera clear, no icterus.   Conjunctiva pink. Ears:  Normal auditory acuity. Nose:  No deformity, discharge,  or lesions. Mouth:  No deformity or lesions, dentition normal. Neck:  Supple; no masses or thyromegaly. Lungs:  Clear throughout to auscultation.   No wheezes, crackles, or rhonchi. No acute distress. Heart:  Regular rate and rhythm; no murmurs, clicks, rubs,  or gallops. Abdomen:  Soft, nontender and nondistended. No masses, hepatosplenomegaly or hernias  noted. Normal bowel sounds, without guarding, and without rebound.   Msk:  Symmetrical without gross deformities. Normal posture. Extremities:  Without clubbing or edema. Neurologic:  Alert and  oriented x4;  grossly normal neurologically. Skin:  Intact without significant lesions or rashes. Cervical Nodes:  No significant cervical adenopathy. Psych:  Alert and cooperative. Normal mood and affect.  Impression/Plan: Steven Lamb is here for a colonoscopy to be performed for colon cancer screening purposes.  The risks of the procedure including infection, bleed, or perforation as well  as benefits, limitations, alternatives and imponderables have been reviewed with the patient. Questions have been answered. All parties agreeable.

## 2022-01-04 NOTE — Progress Notes (Signed)
Patient arrived to pre-op for colonoscopy. Patient was tachycardic and hypertensive. Patient complained of pain 7/10 in left upper quadrant that radiates to back. Dr. Abbey Chatters notified and portable KUB and chest xray was ordered. Patient states pain was 2-3/10 after going to the bathroom and laying down on stretcher.

## 2022-01-05 ENCOUNTER — Encounter (HOSPITAL_BASED_OUTPATIENT_CLINIC_OR_DEPARTMENT_OTHER): Payer: Self-pay | Admitting: Emergency Medicine

## 2022-01-05 ENCOUNTER — Other Ambulatory Visit: Payer: Self-pay

## 2022-01-05 DIAGNOSIS — R0789 Other chest pain: Secondary | ICD-10-CM | POA: Diagnosis not present

## 2022-01-05 DIAGNOSIS — R1012 Left upper quadrant pain: Secondary | ICD-10-CM | POA: Diagnosis not present

## 2022-01-05 DIAGNOSIS — B029 Zoster without complications: Secondary | ICD-10-CM | POA: Diagnosis not present

## 2022-01-05 DIAGNOSIS — Z7982 Long term (current) use of aspirin: Secondary | ICD-10-CM | POA: Insufficient documentation

## 2022-01-05 DIAGNOSIS — R109 Unspecified abdominal pain: Secondary | ICD-10-CM | POA: Diagnosis not present

## 2022-01-05 LAB — CBC
HCT: 42.5 % (ref 39.0–52.0)
Hemoglobin: 14.5 g/dL (ref 13.0–17.0)
MCH: 26.9 pg (ref 26.0–34.0)
MCHC: 34.1 g/dL (ref 30.0–36.0)
MCV: 78.8 fL — ABNORMAL LOW (ref 80.0–100.0)
Platelets: 267 10*3/uL (ref 150–400)
RBC: 5.39 MIL/uL (ref 4.22–5.81)
RDW: 13.2 % (ref 11.5–15.5)
WBC: 8.5 10*3/uL (ref 4.0–10.5)
nRBC: 0 % (ref 0.0–0.2)

## 2022-01-05 NOTE — ED Triage Notes (Signed)
Chest pain radiating into his back. Also reports a rash under L breast area. He had a colonoscopy yesterday. States the pain feels like gas.

## 2022-01-06 ENCOUNTER — Emergency Department (HOSPITAL_BASED_OUTPATIENT_CLINIC_OR_DEPARTMENT_OTHER)
Admission: EM | Admit: 2022-01-06 | Discharge: 2022-01-06 | Disposition: A | Discharge status: Home / Self Care | Payer: BC Managed Care – PPO | Attending: Emergency Medicine | Admitting: Emergency Medicine

## 2022-01-06 ENCOUNTER — Emergency Department (HOSPITAL_BASED_OUTPATIENT_CLINIC_OR_DEPARTMENT_OTHER): Payer: BC Managed Care – PPO

## 2022-01-06 DIAGNOSIS — R079 Chest pain, unspecified: Secondary | ICD-10-CM | POA: Diagnosis not present

## 2022-01-06 DIAGNOSIS — R109 Unspecified abdominal pain: Secondary | ICD-10-CM

## 2022-01-06 DIAGNOSIS — B029 Zoster without complications: Secondary | ICD-10-CM

## 2022-01-06 LAB — BASIC METABOLIC PANEL
Anion gap: 11 (ref 5–15)
BUN: 17 mg/dL (ref 6–20)
CO2: 27 mmol/L (ref 22–32)
Calcium: 9.4 mg/dL (ref 8.9–10.3)
Chloride: 102 mmol/L (ref 98–111)
Creatinine, Ser: 0.91 mg/dL (ref 0.61–1.24)
GFR, Estimated: >60 mL/min (ref >60)
Glucose, Bld: 99 mg/dL (ref 70–99)
Potassium: 3.8 mmol/L (ref 3.5–5.1)
Sodium: 140 mmol/L (ref 135–145)

## 2022-01-06 LAB — TROPONIN I (HIGH SENSITIVITY): Troponin I (High Sensitivity): 8 ng/L (ref <18)

## 2022-01-06 MED ORDER — VALACYCLOVIR HCL 500 MG PO TABS
1000.0000 mg | ORAL_TABLET | Freq: Once | ORAL | Status: AC
  Administered 2022-01-06: 1000 mg via ORAL
  Filled 2022-01-06: qty 2

## 2022-01-06 MED ORDER — VALACYCLOVIR HCL 1 G PO TABS: 1000.0000 mg | ORAL_TABLET | Freq: Three times a day (TID) | ORAL | 0 refills | Status: DC

## 2022-01-06 MED ORDER — OXYCODONE-ACETAMINOPHEN 5-325 MG PO TABS: 1.0000 | ORAL_TABLET | Freq: Four times a day (QID) | ORAL | 0 refills | Status: AC | PRN

## 2022-01-06 MED ORDER — ONDANSETRON HCL 4 MG PO TABS
4.0000 mg | ORAL_TABLET | Freq: Once | ORAL | Status: AC
  Administered 2022-01-06: 4 mg via ORAL
  Filled 2022-01-06: qty 1

## 2022-01-06 MED ORDER — ONDANSETRON HCL 4 MG PO TABS: 4.0000 mg | ORAL_TABLET | Freq: Three times a day (TID) | ORAL | 0 refills | Status: AC | PRN

## 2022-01-06 MED ORDER — OXYCODONE-ACETAMINOPHEN 5-325 MG PO TABS
1.0000 | ORAL_TABLET | Freq: Once | ORAL | Status: AC
  Administered 2022-01-06: 1 via ORAL
  Filled 2022-01-06: qty 1

## 2022-01-06 NOTE — ED Notes (Signed)
Discharge instructions, follow up care, and prescriptions reviewed and explained, pt verbalized understanding. Pt caox4 and ambulatory on departure.  

## 2022-01-06 NOTE — Discharge Instructions (Signed)
Your history, exam, work-up today are consistent with shingles causing the majority of your discomfort and rash.  We had a shared decision-making conversation and given your symptoms starting before the colonoscopy, we have low suspicion for intra-abdominal pathology and agreed together to hold on the radiating CT scan at this time.  Your labs were otherwise reassuring as we discussed.  That being said, if any symptoms change or worsen rapidly, please return to the nearest emergency department.  Please use the pain medicine, nausea medicine, and antiviral to help treat and manage symptoms.  Please rest and stay hydrated.  If any symptoms change or worsen acutely, please return to the nearest emergency department.

## 2022-01-06 NOTE — ED Provider Notes (Signed)
[facility] DEPT Provider Note   CSN: [id number] Arrival date & time: 01/05/22  2306     History  Chief Complaint  Patient presents with   Chest Pain    Steven Lamb is a 51 y.o. male.  The history is provided by the patient, the spouse and medical records. No language interpreter was used.  Chest Pain Pain location:  L chest and L lateral chest Pain quality: aching and burning   Radiates to: L abdomen. Pain severity:  Severe Onset quality:  Gradual Duration:  3 days Timing:  Constant Progression:  Worsening Chronicity:  New Relieved by:  Nothing Worsened by:  Nothing Ineffective treatments:  None tried Associated symptoms: abdominal pain and back pain   Associated symptoms: no cough, no dizziness, no fatigue, no fever, no headache, no nausea, no vomiting and no weakness   Risk factors: male sex       Home Medications Prior to Admission medications   Medication Sig Start Date End Date Taking? Authorizing Provider  aspirin-acetaminophen-caffeine (EXCEDRIN MIGRAINE) [phone] MG tablet Take 1 tablet by mouth 2 (two) times daily as needed for headache.    [provider]  fluticasone (FLONASE) 50 MCG/ACT nasal spray Place 1 spray into both nostrils daily as needed for allergies or rhinitis.    [provider]  ibuprofen (ADVIL) 200 MG tablet Take 800 mg by mouth every 8 (eight) hours as needed (pain.).    [provider]  levETIRAcetam (KEPPRA XR) 500 MG 24 hr tablet Take 3 tablets (1,500 mg total) by mouth daily. 03/06/21   Debbora Presto, NP      Allergies    Patient has no known allergies.    Review of Systems   Review of Systems  Constitutional:  Negative for chills, fatigue and fever.  HENT:  Negative for congestion.   Respiratory:  Negative for cough, chest tightness and wheezing.   Cardiovascular:  Positive for chest pain.  Gastrointestinal:  Positive for abdominal pain. Negative for diarrhea, nausea and  vomiting.  Genitourinary:  Positive for flank pain. Negative for dysuria.  Musculoskeletal:  Positive for back pain. Negative for neck pain.  Skin:  Positive for rash.  Neurological:  Negative for dizziness, weakness, light-headedness and headaches.  Psychiatric/Behavioral:  Negative for agitation and confusion.   All other systems reviewed and are negative.  Physical Exam Updated Vital Signs BP (!) 166/104   Pulse 87   Temp 98.2 F (36.8 C)   Resp 20   SpO2 99%  Physical Exam Vitals and nursing note reviewed.  Constitutional:      General: He is not in acute distress.    Appearance: He is well-developed. He is not ill-appearing, toxic-appearing or diaphoretic.  HENT:     Head: Normocephalic and atraumatic.  Eyes:     Conjunctiva/sclera: Conjunctivae normal.  Cardiovascular:     Rate and Rhythm: Normal rate and regular rhythm.     Heart sounds: No murmur heard. Pulmonary:     Effort: Pulmonary effort is normal. No respiratory distress.     Breath sounds: Normal breath sounds. No decreased breath sounds, wheezing, rhonchi or rales.  Chest:     Chest wall: Tenderness present.  Abdominal:     General: Abdomen is flat. Bowel sounds are normal.     Palpations: Abdomen is soft.     Tenderness: There is abdominal tenderness. There is no guarding or rebound.    Musculoskeletal:        General: No swelling.  Cervical back: Neck supple.     Right lower leg: No edema.     Left lower leg: No edema.  Skin:    General: Skin is warm and dry.     Capillary Refill: Capillary refill takes less than 2 seconds.     Findings: Rash present.  Neurological:     General: No focal deficit present.     Mental Status: He is alert.  Psychiatric:        Mood and Affect: Mood normal.    ED Results / Procedures / Treatments   Labs (all labs ordered are listed, but only abnormal results are displayed) Labs Reviewed  CBC - Abnormal; Notable for the following components:      Result Value    MCV 78.8 (*)    All other components within normal limits  BASIC METABOLIC PANEL  TROPONIN I (HIGH SENSITIVITY)  TROPONIN I (HIGH SENSITIVITY)    EKG EKG Interpretation  Date/Time:  Saturday Jan 05 2022 23:19:12 EDT Ventricular Rate:  82 PR Interval:  124 QRS Duration: 94 QT Interval:  360 QTC Calculation: 420 R Axis:   27 Text Interpretation: Normal sinus rhythm Incomplete right bundle branch block Borderline ECG No previous ECGs available Confirmed by Ripley Fraise [phone] on 01/05/2022 11:21:30 PM  Radiology DG Chest Port 1 View  Result Date: 01/06/2022 CLINICAL DATA:  51 year old male with chest pain radiating to the back. Left chest rash. Status post recent colonoscopy. EXAM: PORTABLE CHEST 1 VIEW COMPARISON:  Acute abdominal series 01/04/2022. FINDINGS: Portable AP view at 0553 hours. Lung volumes and mediastinal contours remain normal. Visualized tracheal air column is within normal limits. Allowing for portable technique the lungs are clear. No pneumothorax or pneumoperitoneum. Paucity of bowel gas in the visible upper abdomen. No acute osseous abnormality identified. IMPRESSION: Negative portable chest. Electronically Signed   By: Genevie Ann M.D.   On: 01/06/2022 06:14   DG ABD ACUTE 2+V W 1V CHEST  Result Date: 01/04/2022 CLINICAL DATA:  Left upper quadrant abdominal pain status post colonoscopy. EXAM: DG ABDOMEN ACUTE WITH 1 VIEW CHEST COMPARISON:  None Available. FINDINGS: There is no evidence of dilated bowel loops or free intraperitoneal air. No radiopaque calculi or other significant radiographic abnormality is seen. Heart size and mediastinal contours are within normal limits. Both lungs are clear. IMPRESSION: Negative abdominal radiographs.  No acute cardiopulmonary disease. Electronically Signed   By: Marijo Conception M.D.   On: 01/04/2022 08:18    Procedures Procedures    Medications Ordered in ED Medications  oxyCODONE-acetaminophen (PERCOCET/ROXICET) 5-325 MG  per tablet 1 tablet (1 tablet Oral Given 01/06/22 0732)  ondansetron (ZOFRAN) tablet 4 mg (4 mg Oral Given 01/06/22 0732)  valACYclovir (VALTREX) tablet 1,000 mg (1,000 mg Oral Given 01/06/22 0732)    ED Course/ Medical Decision Making/ A&P                           Medical Decision Making Risk Prescription drug management.    Steven Lamb is a 51 y.o. male with a past medical history significant for seizures and recent colonoscopy yesterday who presents with rash and pain to left torso.  According to patient, he was having some aching discomfort in his left back wrapping around towards his left chest and left abdomen for the last several days.  He reports that he had a colonoscopy done on Friday without complications but over the last 24 hours developed a rash  in the area of this discomfort.  He denies any nausea, vomiting, constipation, diarrhea, or urinary changes.  Reports the pain is primarily superficial and he denies deeper discomfort.  Denies shortness of breath, cough, fevers, or chills.  Denies any trauma.  Reports the pain is moderate to severe and burning.  Denies any history of this.  On exam, lungs clear and anterior chest is nontender.  Left chest left flank and left back as well as the abdomen are tender over a rash that appears shingles-like.  There are areas of erythema and vesicles that are in a dermatomal distribution.  There are no other distant sites of rash or tenderness seen.  No murmur.  Normal bowel sounds.  Left side nontender.  Exam otherwise unremarkable.  Differential diagnosis included a cardiac cause, abdominal cause, perforation from his colonoscopy, MI, and shingles given his rash.  Patient had work-up starting in triage.  EKG overall reassuring.  Initial troponin negative.  CBC and BMP reassuring.  Chest x-ray shows no evidence of complication and no pneumothorax.  No pneumonia or other acute abnormality.  Given his rash I highly suspect shingles.  As his  symptoms began before the colonoscopy I have less suspicion for a perforation or some splenic injury due to the colonoscopy.  He is not on blood thinners.  Had a shared decision-making conversation and given his lack of other pain and the rash, patient agrees this is shingles-like.  He agrees to hold on CT imaging at this time.  We gave him some pain medicine, nausea medicine, and Valtrex and his symptoms have drastically improved.  Patient will follow-up with his PCP and will take the antiviral valacyclovir for the next week.  He will also take the pain and nausea medicine to help manage symptoms.  He understood return precautions and follow-up instructions if symptoms were to worsen.  We agreed that if his symptoms drastically worsened he may need to come back and get CT imaging as we discussed.  They had no other questions or concerns and patient discharged in good condition feeling much better.         Final Clinical Impression(s) / ED Diagnoses Final diagnoses:  Left lateral abdominal pain  Herpes zoster without complication    Rx / DC Orders ED Discharge Orders          Ordered    valACYclovir (VALTREX) 1000 MG tablet  3 times daily        01/06/22 0934    oxyCODONE-acetaminophen (PERCOCET/ROXICET) 5-325 MG tablet  Every 6 hours PRN        01/06/22 0934    ondansetron (ZOFRAN) 4 MG tablet  Every 8 hours PRN        01/06/22 0934            Clinical Impression: 1. Left lateral abdominal pain   2. Herpes zoster without complication     Disposition: Discharge  Condition: Good  I have discussed the results, Dx and Tx plan with the pt(& family if present). He/she/they expressed understanding and agree(s) with the plan. Discharge instructions discussed at great length. Strict return precautions discussed and pt &/or family have verbalized understanding of the instructions. No further questions at time of discharge.    New Prescriptions   ONDANSETRON (ZOFRAN) 4 MG  TABLET    Take 1 tablet (4 mg total) by mouth every 8 (eight) hours as needed for nausea or vomiting.   OXYCODONE-ACETAMINOPHEN (PERCOCET/ROXICET) 5-325 MG TABLET    Take 1  tablet by mouth every 6 (six) hours as needed for severe pain.   VALACYCLOVIR (VALTREX) 1000 MG TABLET    Take 1 tablet (1,000 mg total) by mouth 3 (three) times daily for 7 days.    Follow Up: Jake Samples, PA-C [address] [place] [address] [phone]         Zackry Deines, Gwenyth Allegra, MD 01/06/22 [phone]

## 2022-01-07 LAB — SURGICAL PATHOLOGY

## 2022-01-08 ENCOUNTER — Telehealth: Payer: BC Managed Care – PPO | Admitting: Nurse Practitioner

## 2022-01-08 DIAGNOSIS — B029 Zoster without complications: Secondary | ICD-10-CM

## 2022-01-08 NOTE — Progress Notes (Signed)
E-visit for Shingles   We do not typically recommend steroids while on an anti-viral medication as they can weaken the response to anti-virals (Valtrex) and delay your healing process.   You can increase oral antihistamine use and that is safer,  For instance taking Claritin three times daily and a benadryl before bed- If you have not tried that I would start with that regimen in order to avoid steroid use. Below are some other helpful over the counter regimens as well:  Claritin '10mg'$  three times daily for the next 48 hours then  Claritin '10mg'$  twice daily for two days then  Claritin once daily until symptoms resolve   Benadryl 25-'50mg'$  before bed for itching until symptom resolve.    Based on what you shared with me it looks like you have shingles.  Shingles or herpes zoster, is a common infection of the nerves.  It is a painful rash caused by the herpes zoster virus.  This is the same virus that causes chickenpox.  After a person has chickenpox, the virus remains inactive in the nerve cells.  Years later, the virus can become active again and travel to the skin.  It typically will appear on one side of the face or body.  Burning or shooting pain, tingling, or itching are early signs of the infection.  Blisters typically scab over in 7 to 10 days and clear up within 2-4 weeks. Shingles is only contagious to people that have never had the chickenpox, the chickenpox vaccine, or anyone who has a compromised immune system.  You should avoid contact with these type of people until your blisters scab over.    HOME CARE: Apply ice packs (wrapped in a thin towel), cool compresses, or soak in cool bath to help reduce pain. Use calamine lotion to calm itchy skin. Avoid scratching the rash. Avoid direct sunlight.  GET HELP RIGHT AWAY IF: Symptoms that don't away after treatment. A rash or blisters near your eye. Increased drainage, fever, or rash after treatment. Severe pain that doesn't go  away.   MAKE SURE YOU   Understand these instructions. Will watch your condition. Will get help right away if you are not doing well or get worse.  Thank you for choosing an e-visit.  Your e-visit answers were reviewed by a board certified advanced clinical practitioner to complete your personal care plan. Depending upon the condition, your plan could have included both over the counter or prescription medications.  Please review your pharmacy choice. Make sure the pharmacy is open so you can pick up prescription now. If there is a problem, you may contact your provider through [employer] and have the prescription routed to another pharmacy.  Your safety is important to [place]. If you have drug allergies check your prescription carefully.   For the next 24 hours you can use MyChart to ask questions about today's visit, request a non-urgent call back, or ask for a work or school excuse. You will get an email in the next two days asking about your experience. I hope that your e-visit has been valuable and will speed your recovery.   I spent approximately 7 minutes reviewing the patient's history, current symptoms and coordinating their plan of care today.

## 2022-01-10 ENCOUNTER — Encounter (HOSPITAL_COMMUNITY): Payer: Self-pay | Admitting: Internal Medicine

## 2022-01-11 ENCOUNTER — Telehealth: Payer: BC Managed Care – PPO | Admitting: Physician Assistant

## 2022-01-11 DIAGNOSIS — B029 Zoster without complications: Secondary | ICD-10-CM

## 2022-01-11 MED ORDER — GABAPENTIN 300 MG PO CAPS: 300.0000 mg | ORAL_CAPSULE | Freq: Two times a day (BID) | ORAL | 0 refills | Status: DC

## 2022-01-11 MED ORDER — VALACYCLOVIR HCL 1 G PO TABS: 1000.0000 mg | ORAL_TABLET | Freq: Three times a day (TID) | ORAL | 0 refills | Status: AC

## 2022-01-11 NOTE — Progress Notes (Signed)
I have spent 5 minutes in review of e-visit questionnaire, review and updating patient chart, medical decision making and response to patient.   Jawon Dipiero Cody Colletta Spillers, PA-C    

## 2022-01-11 NOTE — Progress Notes (Signed)
E-visit for Shingles   We are sorry that you are not feeling well. Here is how we plan to help!  Based on what you shared with me it looks like you have shingles.  Shingles or herpes zoster, is a common infection of the nerves.  It is a painful rash caused by the herpes zoster virus.  This is the same virus that causes chickenpox.  After a person has chickenpox, the virus remains inactive in the nerve cells.  Years later, the virus can become active again and travel to the skin.  It typically will appear on one side of the face or body.  Burning or shooting pain, tingling, or itching are early signs of the infection.  Blisters typically scab over in 7 to 10 days and clear up within 2-4 weeks. Shingles is only contagious to people that have never had the chickenpox, the chickenpox vaccine, or anyone who has a compromised immune system.  You should avoid contact with these type of people until your blisters scab over.  I have prescribed Valacyclovir 1g three times daily -- will extend for 3 more days to make a total 10 day course of antiviral which should be sufficient. We do not prescribe narcotic pain medications through virtual visits (e-visits and videos) so we cannot refill this. You can speak with your PCP to see if they are willing to refill. I recommend using Gabapentin which is a medication specifically for nerve pain (including from shingles) I will send this in to the pharmacy for you to have on hand.    HOME CARE: Apply ice packs (wrapped in a thin towel), cool compresses, or soak in cool bath to help reduce pain. Use calamine lotion to calm itchy skin. Avoid scratching the rash. Avoid direct sunlight.  GET HELP RIGHT AWAY IF: Symptoms that don't away after treatment. A rash or blisters near your eye. Increased drainage, fever, or rash after treatment. Severe pain that doesn't go away.   MAKE SURE YOU   Understand these instructions. Will watch your condition. Will get help  right away if you are not doing well or get worse.  Thank you for choosing an e-visit.  Your e-visit answers were reviewed by a board certified advanced clinical practitioner to complete your personal care plan. Depending upon the condition, your plan could have included both over the counter or prescription medications.  Please review your pharmacy choice. Make sure the pharmacy is open so you can pick up prescription now. If there is a problem, you may contact your provider through [employer] and have the prescription routed to another pharmacy.  Your safety is important to [place]. If you have drug allergies check your prescription carefully.   For the next 24 hours you can use MyChart to ask questions about today's visit, request a non-urgent call back, or ask for a work or school excuse. You will get an email in the next two days asking about your experience. I hope that your e-visit has been valuable and will speed your recovery.

## 2022-01-31 ENCOUNTER — Telehealth: Payer: BC Managed Care – PPO | Admitting: Physician Assistant

## 2022-01-31 DIAGNOSIS — B0229 Other postherpetic nervous system involvement: Secondary | ICD-10-CM | POA: Diagnosis not present

## 2022-01-31 MED ORDER — GABAPENTIN 300 MG PO CAPS: 300.0000 mg | ORAL_CAPSULE | Freq: Two times a day (BID) | ORAL | 0 refills | Status: AC

## 2022-01-31 NOTE — Progress Notes (Signed)
E-visit for Shingles   We are sorry that you are not feeling well. Here is how we plan to help!  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  This is basically when nerve pain persists even after rash is resolving. Can last for a few days or several weeks (more common in the elderly though). I will continue the Gabapentin twice daily for the next 15 days. If anything is needed after that point, you will need to follow-up with your PCP.     HOME CARE: Apply ice packs (wrapped in a thin towel), cool compresses, or soak in cool bath to help reduce pain. Use calamine lotion to calm itchy skin. Avoid scratching the rash. Avoid direct sunlight.  GET HELP RIGHT AWAY IF: Symptoms that don't away after treatment. A rash or blisters near your eye. Increased drainage, fever, or rash after treatment. Severe pain that doesn't go away.   MAKE SURE YOU   Understand these instructions. Will watch your condition. Will get help right away if you are not doing well or get worse.  Thank you for choosing an e-visit.  Your e-visit answers were reviewed by a board certified advanced clinical practitioner to complete your personal care plan. Depending upon the condition, your plan could have included both over the counter or prescription medications.  Please review your pharmacy choice. Make sure the pharmacy is open so you can pick up prescription now. If there is a problem, you may contact your provider through [employer] and have the prescription routed to another pharmacy.  Your safety is important to [place]. If you have drug allergies check your prescription carefully.   For the next 24 hours you can use MyChart to ask questions about today's visit, request a non-urgent call back, or ask for a work or school excuse. You will get an email in the next two days asking about your experience. I hope that your e-visit has been valuable and  will speed your recovery.

## 2022-01-31 NOTE — Progress Notes (Signed)
I have spent 5 minutes in review of e-visit questionnaire, review and updating patient chart, medical decision making and response to patient.   Merric Yost Cody Heiley Shaikh, PA-C    

## 2022-03-06 ENCOUNTER — Telehealth: Payer: BC Managed Care – PPO | Admitting: Physician Assistant

## 2022-03-06 ENCOUNTER — Ambulatory Visit: Payer: BC Managed Care – PPO | Admitting: Family Medicine

## 2022-03-06 DIAGNOSIS — J019 Acute sinusitis, unspecified: Secondary | ICD-10-CM

## 2022-03-06 DIAGNOSIS — B9689 Other specified bacterial agents as the cause of diseases classified elsewhere: Secondary | ICD-10-CM

## 2022-03-06 MED ORDER — AMOXICILLIN-POT CLAVULANATE 875-125 MG PO TABS: 1.0000 | ORAL_TABLET | Freq: Two times a day (BID) | ORAL | 0 refills | Status: DC

## 2022-03-06 NOTE — Progress Notes (Signed)

## 2022-03-07 DIAGNOSIS — H52203 Unspecified astigmatism, bilateral: Secondary | ICD-10-CM | POA: Diagnosis not present

## 2022-03-07 DIAGNOSIS — H5213 Myopia, bilateral: Secondary | ICD-10-CM | POA: Diagnosis not present
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
## 2022-05-01 NOTE — Patient Instructions (Addendum)
Below is our plan:  We will continue levetiracetam XR '1500mg'$  daily. Keep an eye on headaches. Consider switching to decaf coffee. Drink at least 60 ounces of water daily. Keep an eye on blood pressure. Discuss rizatriptan or sumatriptan with PCP.   Please make sure you are consistent with timing of seizure medication. I recommend annual visit with primary care provider (PCP) for complete physical and routine blood work. I recommend daily intake of vitamin D (400-800iu) and calcium (800-'1000mg'$ ) for bone health. Discuss Dexa screening with PCP.   According to [place] law, you can not drive unless you are seizure / syncope free for at least 6 months and under physician's care.  Please maintain precautions. Do not participate in activities where a loss of awareness could harm you or someone else. No swimming alone, no tub bathing, no hot tubs, no driving, no operating motorized vehicles (cars, ATVs, motocycles, etc), lawnmowers, power tools or firearms. No standing at heights, such as rooftops, ladders or stairs. Avoid hot objects such as stoves, heaters, open fires. Wear a helmet when riding a bicycle, scooter, skateboard, etc. and avoid areas of traffic. Set your water heater to 120 degrees or less.  Please make sure you are staying well hydrated. I recommend 50-60 ounces daily. Well balanced diet and regular exercise encouraged. Consistent sleep schedule with 6-8 hours recommended.   Please continue follow up with care team as directed.   Follow up with me in 1 year   You may receive a survey regarding today's visit. I encourage you to leave honest feed back as I do use this information to improve patient care. Thank you for seeing me today!

## 2022-05-02 ENCOUNTER — Encounter: Payer: Self-pay | Admitting: Family Medicine

## 2022-05-02 ENCOUNTER — Ambulatory Visit: Payer: BC Managed Care – PPO | Admitting: Family Medicine

## 2022-05-02 VITALS — BP 151/103 | HR 76 | Ht 70.0 in | Wt 221.6 lb

## 2022-05-02 DIAGNOSIS — G40B09 Juvenile myoclonic epilepsy, not intractable, without status epilepticus: Secondary | ICD-10-CM | POA: Diagnosis not present

## 2022-05-02 MED ORDER — LEVETIRACETAM ER 500 MG PO TB24: 1500.0000 mg | ORAL_TABLET | Freq: Every day | ORAL | 4 refills | Status: DC

## 2022-12-09 ENCOUNTER — Telehealth: Payer: BC Managed Care – PPO | Admitting: Nurse Practitioner

## 2022-12-09 DIAGNOSIS — J0140 Acute pansinusitis, unspecified: Secondary | ICD-10-CM

## 2022-12-09 MED ORDER — AMOXICILLIN-POT CLAVULANATE 875-125 MG PO TABS: 1.0000 | ORAL_TABLET | Freq: Two times a day (BID) | ORAL | 0 refills | Status: AC

## 2022-12-09 NOTE — Progress Notes (Signed)

## 2022-12-12 DIAGNOSIS — R0789 Other chest pain: Secondary | ICD-10-CM | POA: Diagnosis not present

## 2022-12-12 DIAGNOSIS — G40309 Generalized idiopathic epilepsy and epileptic syndromes, not intractable, without status epilepticus: Secondary | ICD-10-CM | POA: Diagnosis not present

## 2022-12-12 DIAGNOSIS — Z6832 Body mass index (BMI) 32.0-32.9, adult: Secondary | ICD-10-CM | POA: Diagnosis not present

## 2022-12-12 DIAGNOSIS — E6609 Other obesity due to excess calories: Secondary | ICD-10-CM | POA: Diagnosis not present

## 2022-12-12 DIAGNOSIS — Z0001 Encounter for general adult medical examination with abnormal findings: Secondary | ICD-10-CM | POA: Diagnosis not present

## 2022-12-12 DIAGNOSIS — Z1331 Encounter for screening for depression: Secondary | ICD-10-CM | POA: Diagnosis not present

## 2022-12-27 ENCOUNTER — Ambulatory Visit: Payer: BC Managed Care – PPO | Attending: Internal Medicine | Admitting: Internal Medicine

## 2022-12-27 VITALS — BP 138/82 | HR 78 | Ht 70.0 in | Wt 225.0 lb

## 2022-12-27 DIAGNOSIS — R030 Elevated blood-pressure reading, without diagnosis of hypertension: Secondary | ICD-10-CM

## 2022-12-27 NOTE — Progress Notes (Signed)
Cardiology Office Note:    Date:  12/27/2022   ID:  [name], DOB Jan 09, 1971, MRN [id number]  PCP:  Ladon Applebaum   [facility] Providers Cardiologist:  None     Referring MD: Avis Epley, PA*   No chief complaint on file. HTN  History of Present Illness:    Steven Lamb is a 52 y.o. male with no significant pmhx, notes had a BP of 122/90 mmhg. Noted he had heart burn/gas. This has improved. He walks each day. He mows his yard. Notes blood pressure has fluctuated. Has family hx of HTN.  No DM2. Non smoker.  Father had MI at 58, bypass and ICD.    Past Medical History:  Diagnosis Date   Seizures ([facility])    last sz 02/10/16    Past Surgical History:  Procedure Laterality Date   COLONOSCOPY WITH PROPOFOL N/A 01/04/2022   Procedure: COLONOSCOPY WITH PROPOFOL;  Surgeon: Lanelle Bal, DO;  [facility];  Service: Endoscopy;  Laterality: N/A;  8:30 / ASA 2   NO PAST SURGERIES     POLYPECTOMY  01/04/2022   Procedure: POLYPECTOMY;  Surgeon: Lanelle Bal, DO;  [facility];  Service: Endoscopy;;    Current Medications: Current Meds  Medication Sig   aspirin-acetaminophen-caffeine (EXCEDRIN MIGRAINE) 250-250-65 MG tablet Take 1 tablet by mouth 2 (two) times daily as needed for headache.   atorvastatin (LIPITOR) 10 MG tablet Take 10 mg by mouth daily.   fluticasone (FLONASE) 50 MCG/ACT nasal spray Place 1 spray into both nostrils daily as needed for allergies or rhinitis.   gabapentin (NEURONTIN) 300 MG capsule Take 1 capsule (300 mg total) by mouth 2 (two) times daily.   ibuprofen (ADVIL) 200 MG tablet Take 800 mg by mouth every 8 (eight) hours as needed (pain.).   levETIRAcetam (KEPPRA XR) 500 MG 24 hr tablet Take 3 tablets (1,500 mg total) by mouth daily.   ondansetron (ZOFRAN) 4 MG tablet Take 1 tablet (4 mg total) by mouth every 8 (eight) hours as needed for nausea or vomiting.   oxyCODONE-acetaminophen  (PERCOCET/ROXICET) 5-325 MG tablet Take 1 tablet by mouth every 6 (six) hours as needed for severe pain.     Allergies:   Patient has no known allergies.   Social History   Socioeconomic History   Marital status: Married    Spouse name: Boneta Lucks   Number of children: 2   Years of education: masters   Highest education level: Not on file  Occupational History   Occupation: Architectural technologist  Tobacco Use   Smoking status: Never   Smokeless tobacco: Never  Vaping Use   Vaping Use: Never used  Substance and Sexual Activity   Alcohol use: No   Drug use: No   Sexual activity: Not on file  Other Topics Concern   Not on file  Social History Narrative   Patient lives at home with family.   Caffeine Use: 1-2 cups of coffee; 12-20oz of soda daily   Social Determinants of Health   Financial Resource Strain: Not on file  Food Insecurity: Not on file  Transportation Needs: Not on file  Physical Activity: Not on file  Stress: Not on file  Social Connections: Not on file     Family History: The patient's family history includes Epilepsy in an other family member; Heart disease in his father. There is no history of Colon cancer.  ROS:   Please see the  history of present illness.     All other systems reviewed and are negative.  EKGs/Labs/Other Studies Reviewed:    The following studies were reviewed today:   EKG:  EKG is  ordered today.  The ekg ordered today demonstrates   12/27/2022- NSR, IRBBB  Recent Labs: 01/05/2022: BUN 17; Creatinine, Ser 0.91; Hemoglobin 14.5; Platelets 267; Potassium 3.8; Sodium 140  Recent Lipid Panel No results found for: "CHOL", "TRIG", "HDL", "CHOLHDL", "VLDL", "LDLCALC", "LDLDIRECT"   Risk Assessment/Calculations:    Physical Exam:    VS:  BP 138/82 (BP Location: Left Arm, Patient Position: Sitting, Cuff Size: Normal)   Pulse 78   Ht 5\' 10"  (1.778 m)   Wt 225 lb (102.1 kg)   SpO2 95%   BMI 32.28 kg/m     Wt Readings from Last 3  Encounters:  12/27/22 225 lb (102.1 kg)  05/02/22 221 lb 9.6 oz (100.5 kg)  01/04/22 212 lb (96.2 kg)     GEN:  Well nourished, well developed in no acute distress HEENT: Normal NECK: No JVD CARDIAC: RRR, no murmurs, rubs, gallops RESPIRATORY:  Clear to auscultation without rales, wheezing or rhonchi  ABDOMEN: Soft, non-tender, non-distended MUSCULOSKELETAL:  No edema; No deformity  SKIN: Warm and dry NEUROLOGIC:  Alert and oriented x 3 PSYCHIATRIC:  Normal affect   ASSESSMENT:    Elevated Blood Pressure:  - he has low risk and not stage II HTN.  PLAN:    In order of problems listed above:  Will provide BP education materials. Plan to check ambulatory Bps and make lifestyle changes. He can keep a log for a week and provide to his PCP. If his Bps are consistently >130/80 mmHg, can start chlorthalidone 25 mg daily      Medication Adjustments/Labs and Tests Ordered: Current medicines are reviewed at length with the patient today.  Concerns regarding medicines are outlined above.  No orders of the defined types were placed in this encounter.  No orders of the defined types were placed in this encounter.   There are no Patient Instructions on file for this visit.   Signed, Maisie Fus, MD  12/27/2022 1:26 PM    [facility]

## 2022-12-27 NOTE — Patient Instructions (Addendum)
Medication Instructions:  No changes *If you need a refill on your cardiac medications before your next appointment, please call your pharmacy*  Follow-Up: At [facility], you and your health needs are our priority.  As part of our continuing mission to provide you with exceptional heart care, we have created designated Provider Care Teams.  These Care Teams include your primary Cardiologist (physician) and Advanced Practice Providers (APPs -  Physician Assistants and Nurse Practitioners) who all work together to provide you with the care you need, when you need it.  We recommend signing up for the patient portal called "MyChart".  Sign up information is provided on this After Visit Summary.  MyChart is used to connect with patients for Virtual Visits (Telemedicine).  Patients are able to view lab/test results, encounter notes, upcoming appointments, etc.  Non-urgent messages can be sent to your provider as well.   To learn more about what you can do with MyChart, go to [email or web address].    Your next appointment:    Follow up as needed  Provider:   Dr Wyline Mood  Check BP for a week and report findings to your pcp

## 2023-02-04 DIAGNOSIS — L821 Other seborrheic keratosis: Secondary | ICD-10-CM | POA: Diagnosis not present

## 2023-02-04 DIAGNOSIS — L814 Other melanin hyperpigmentation: Secondary | ICD-10-CM | POA: Diagnosis not present

## 2023-02-04 DIAGNOSIS — D225 Melanocytic nevi of trunk: Secondary | ICD-10-CM | POA: Diagnosis not present

## 2023-02-04 DIAGNOSIS — Z872 Personal history of diseases of the skin and subcutaneous tissue: Secondary | ICD-10-CM | POA: Diagnosis not present
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
## 2023-05-01 NOTE — Patient Instructions (Addendum)
Below is our plan:  We will continue levetiracetam XR 1500mg  daily. Keep an eye on your BP at home.   Please make sure you are consistent with timing of seizure medication. I recommend annual visit with primary care provider (PCP) for complete physical and routine blood work. I recommend daily intake of vitamin D (400-800iu) and calcium (800-1000mg ) for bone health. Discuss Dexa screening with PCP.   According to  law, you can not drive unless you are seizure / syncope free for at least 6 months and under physician's care.  Please maintain precautions. Do not participate in activities where a loss of awareness could harm you or someone else. No swimming alone, no tub bathing, no hot tubs, no driving, no operating motorized vehicles (cars, ATVs, motocycles, etc), lawnmowers, power tools or firearms. No standing at heights, such as rooftops, ladders or stairs. Avoid hot objects such as stoves, heaters, open fires. Wear a helmet when riding a bicycle, scooter, skateboard, etc. and avoid areas of traffic. Set your water heater to 120 degrees or less.  SUDEP is the sudden, unexpected death of someone with epilepsy, who was otherwise healthy. In SUDEP cases, no other cause of death is found when an autopsy is done. Each year, more than 1 in 1,000 people with epilepsy die from SUDEP. This is the leading cause of death in people with uncontrolled seizures. Until further answers are available, the best way to prevent SUDEP is to lower your risk by controlling seizures. Research has found that people with all types of epilepsy that experience convulsive seizures can be at risk.  Please make sure you are staying well hydrated. I recommend 50-60 ounces daily. Well balanced diet and regular exercise encouraged. Consistent sleep schedule with 6-8 hours recommended.   Please continue follow up with care team as directed.   Follow up with me in 1 year   You may receive a survey regarding today's visit. I  encourage you to leave honest feed back as I do use this information to improve patient care. Thank you for seeing me today!

## 2023-05-05 ENCOUNTER — Ambulatory Visit: Payer: BC Managed Care – PPO | Admitting: Family Medicine

## 2023-05-05 ENCOUNTER — Encounter: Payer: Self-pay | Admitting: Family Medicine

## 2023-05-05 VITALS — BP 162/102 | HR 75 | Ht 70.0 in | Wt 222.5 lb

## 2023-05-05 DIAGNOSIS — G40B09 Juvenile myoclonic epilepsy, not intractable, without status epilepticus: Secondary | ICD-10-CM

## 2023-05-05 DIAGNOSIS — R030 Elevated blood-pressure reading, without diagnosis of hypertension: Secondary | ICD-10-CM

## 2023-05-05 MED ORDER — LEVETIRACETAM ER 500 MG PO TB24: 1500.0000 mg | ORAL_TABLET | Freq: Every day | ORAL | 4 refills | Status: DC

## 2023-10-13 DIAGNOSIS — M545 Low back pain, unspecified: Secondary | ICD-10-CM | POA: Diagnosis not present

## 2023-10-13 DIAGNOSIS — M542 Cervicalgia: Secondary | ICD-10-CM | POA: Diagnosis not present

## 2023-10-13 DIAGNOSIS — M9901 Segmental and somatic dysfunction of cervical region: Secondary | ICD-10-CM | POA: Diagnosis not present

## 2023-10-13 DIAGNOSIS — M9903 Segmental and somatic dysfunction of lumbar region: Secondary | ICD-10-CM | POA: Diagnosis not present

## 2023-10-14 DIAGNOSIS — M545 Low back pain, unspecified: Secondary | ICD-10-CM | POA: Diagnosis not present

## 2023-10-14 DIAGNOSIS — M9903 Segmental and somatic dysfunction of lumbar region: Secondary | ICD-10-CM | POA: Diagnosis not present

## 2023-10-14 DIAGNOSIS — M9901 Segmental and somatic dysfunction of cervical region: Secondary | ICD-10-CM | POA: Diagnosis not present

## 2023-10-14 DIAGNOSIS — M542 Cervicalgia: Secondary | ICD-10-CM | POA: Diagnosis not present

## 2023-10-15 DIAGNOSIS — M9903 Segmental and somatic dysfunction of lumbar region: Secondary | ICD-10-CM | POA: Diagnosis not present

## 2023-10-15 DIAGNOSIS — M542 Cervicalgia: Secondary | ICD-10-CM | POA: Diagnosis not present

## 2023-10-15 DIAGNOSIS — M9901 Segmental and somatic dysfunction of cervical region: Secondary | ICD-10-CM | POA: Diagnosis not present

## 2023-10-15 DIAGNOSIS — M545 Low back pain, unspecified: Secondary | ICD-10-CM | POA: Diagnosis not present

## 2023-10-17 DIAGNOSIS — M9901 Segmental and somatic dysfunction of cervical region: Secondary | ICD-10-CM | POA: Diagnosis not present

## 2023-10-17 DIAGNOSIS — M542 Cervicalgia: Secondary | ICD-10-CM | POA: Diagnosis not present

## 2023-10-17 DIAGNOSIS — M545 Low back pain, unspecified: Secondary | ICD-10-CM | POA: Diagnosis not present

## 2023-10-17 DIAGNOSIS — M9903 Segmental and somatic dysfunction of lumbar region: Secondary | ICD-10-CM | POA: Diagnosis not present

## 2023-10-21 DIAGNOSIS — M545 Low back pain, unspecified: Secondary | ICD-10-CM | POA: Diagnosis not present

## 2023-10-21 DIAGNOSIS — M542 Cervicalgia: Secondary | ICD-10-CM | POA: Diagnosis not present

## 2023-10-21 DIAGNOSIS — M9901 Segmental and somatic dysfunction of cervical region: Secondary | ICD-10-CM | POA: Diagnosis not present

## 2023-10-21 DIAGNOSIS — M9903 Segmental and somatic dysfunction of lumbar region: Secondary | ICD-10-CM | POA: Diagnosis not present

## 2023-10-22 DIAGNOSIS — M542 Cervicalgia: Secondary | ICD-10-CM | POA: Diagnosis not present

## 2023-10-22 DIAGNOSIS — M9901 Segmental and somatic dysfunction of cervical region: Secondary | ICD-10-CM | POA: Diagnosis not present

## 2023-10-22 DIAGNOSIS — M9903 Segmental and somatic dysfunction of lumbar region: Secondary | ICD-10-CM | POA: Diagnosis not present

## 2023-10-22 DIAGNOSIS — M545 Low back pain, unspecified: Secondary | ICD-10-CM | POA: Diagnosis not present

## 2023-10-24 DIAGNOSIS — M545 Low back pain, unspecified: Secondary | ICD-10-CM | POA: Diagnosis not present

## 2023-10-24 DIAGNOSIS — M542 Cervicalgia: Secondary | ICD-10-CM | POA: Diagnosis not present

## 2023-10-24 DIAGNOSIS — M9903 Segmental and somatic dysfunction of lumbar region: Secondary | ICD-10-CM | POA: Diagnosis not present

## 2023-10-24 DIAGNOSIS — M9901 Segmental and somatic dysfunction of cervical region: Secondary | ICD-10-CM | POA: Diagnosis not present

## 2023-10-28 DIAGNOSIS — M9901 Segmental and somatic dysfunction of cervical region: Secondary | ICD-10-CM | POA: Diagnosis not present

## 2023-10-28 DIAGNOSIS — M545 Low back pain, unspecified: Secondary | ICD-10-CM | POA: Diagnosis not present

## 2023-10-28 DIAGNOSIS — M542 Cervicalgia: Secondary | ICD-10-CM | POA: Diagnosis not present

## 2023-10-28 DIAGNOSIS — M9903 Segmental and somatic dysfunction of lumbar region: Secondary | ICD-10-CM | POA: Diagnosis not present

## 2023-10-29 DIAGNOSIS — M9903 Segmental and somatic dysfunction of lumbar region: Secondary | ICD-10-CM | POA: Diagnosis not present

## 2023-10-29 DIAGNOSIS — M542 Cervicalgia: Secondary | ICD-10-CM | POA: Diagnosis not present

## 2023-10-29 DIAGNOSIS — M9901 Segmental and somatic dysfunction of cervical region: Secondary | ICD-10-CM | POA: Diagnosis not present

## 2023-10-29 DIAGNOSIS — M545 Low back pain, unspecified: Secondary | ICD-10-CM | POA: Diagnosis not present

## 2023-10-31 DIAGNOSIS — M545 Low back pain, unspecified: Secondary | ICD-10-CM | POA: Diagnosis not present

## 2023-10-31 DIAGNOSIS — M9901 Segmental and somatic dysfunction of cervical region: Secondary | ICD-10-CM | POA: Diagnosis not present

## 2023-10-31 DIAGNOSIS — M9903 Segmental and somatic dysfunction of lumbar region: Secondary | ICD-10-CM | POA: Diagnosis not present

## 2023-10-31 DIAGNOSIS — M542 Cervicalgia: Secondary | ICD-10-CM | POA: Diagnosis not present

## 2023-11-05 DIAGNOSIS — M545 Low back pain, unspecified: Secondary | ICD-10-CM | POA: Diagnosis not present

## 2023-11-05 DIAGNOSIS — M542 Cervicalgia: Secondary | ICD-10-CM | POA: Diagnosis not present

## 2023-11-05 DIAGNOSIS — M9901 Segmental and somatic dysfunction of cervical region: Secondary | ICD-10-CM | POA: Diagnosis not present

## 2023-11-05 DIAGNOSIS — M9903 Segmental and somatic dysfunction of lumbar region: Secondary | ICD-10-CM | POA: Diagnosis not present

## 2023-11-06 DIAGNOSIS — M542 Cervicalgia: Secondary | ICD-10-CM | POA: Diagnosis not present

## 2023-11-06 DIAGNOSIS — M545 Low back pain, unspecified: Secondary | ICD-10-CM | POA: Diagnosis not present

## 2023-11-06 DIAGNOSIS — M9901 Segmental and somatic dysfunction of cervical region: Secondary | ICD-10-CM | POA: Diagnosis not present

## 2023-11-06 DIAGNOSIS — M9903 Segmental and somatic dysfunction of lumbar region: Secondary | ICD-10-CM | POA: Diagnosis not present

## 2023-11-07 DIAGNOSIS — M542 Cervicalgia: Secondary | ICD-10-CM | POA: Diagnosis not present

## 2023-11-07 DIAGNOSIS — M9901 Segmental and somatic dysfunction of cervical region: Secondary | ICD-10-CM | POA: Diagnosis not present

## 2023-11-07 DIAGNOSIS — M545 Low back pain, unspecified: Secondary | ICD-10-CM | POA: Diagnosis not present

## 2023-11-07 DIAGNOSIS — M9903 Segmental and somatic dysfunction of lumbar region: Secondary | ICD-10-CM | POA: Diagnosis not present

## 2023-11-12 DIAGNOSIS — M9903 Segmental and somatic dysfunction of lumbar region: Secondary | ICD-10-CM | POA: Diagnosis not present

## 2023-11-12 DIAGNOSIS — M542 Cervicalgia: Secondary | ICD-10-CM | POA: Diagnosis not present

## 2023-11-12 DIAGNOSIS — M545 Low back pain, unspecified: Secondary | ICD-10-CM | POA: Diagnosis not present

## 2023-11-12 DIAGNOSIS — M9901 Segmental and somatic dysfunction of cervical region: Secondary | ICD-10-CM | POA: Diagnosis not present

## 2023-11-14 DIAGNOSIS — M9901 Segmental and somatic dysfunction of cervical region: Secondary | ICD-10-CM | POA: Diagnosis not present

## 2023-11-14 DIAGNOSIS — M542 Cervicalgia: Secondary | ICD-10-CM | POA: Diagnosis not present

## 2023-11-14 DIAGNOSIS — M545 Low back pain, unspecified: Secondary | ICD-10-CM | POA: Diagnosis not present

## 2023-11-14 DIAGNOSIS — M9903 Segmental and somatic dysfunction of lumbar region: Secondary | ICD-10-CM | POA: Diagnosis not present

## 2023-11-26 DIAGNOSIS — M542 Cervicalgia: Secondary | ICD-10-CM | POA: Diagnosis not present

## 2023-11-26 DIAGNOSIS — M545 Low back pain, unspecified: Secondary | ICD-10-CM | POA: Diagnosis not present

## 2023-11-26 DIAGNOSIS — M9901 Segmental and somatic dysfunction of cervical region: Secondary | ICD-10-CM | POA: Diagnosis not present

## 2023-11-26 DIAGNOSIS — M9903 Segmental and somatic dysfunction of lumbar region: Secondary | ICD-10-CM | POA: Diagnosis not present

## 2023-12-02 DIAGNOSIS — M545 Low back pain, unspecified: Secondary | ICD-10-CM | POA: Diagnosis not present

## 2023-12-02 DIAGNOSIS — M9903 Segmental and somatic dysfunction of lumbar region: Secondary | ICD-10-CM | POA: Diagnosis not present

## 2023-12-02 DIAGNOSIS — M9901 Segmental and somatic dysfunction of cervical region: Secondary | ICD-10-CM | POA: Diagnosis not present

## 2023-12-02 DIAGNOSIS — M542 Cervicalgia: Secondary | ICD-10-CM | POA: Diagnosis not present

## 2023-12-03 DIAGNOSIS — M545 Low back pain, unspecified: Secondary | ICD-10-CM | POA: Diagnosis not present

## 2023-12-03 DIAGNOSIS — M542 Cervicalgia: Secondary | ICD-10-CM | POA: Diagnosis not present

## 2023-12-03 DIAGNOSIS — M9903 Segmental and somatic dysfunction of lumbar region: Secondary | ICD-10-CM | POA: Diagnosis not present

## 2023-12-03 DIAGNOSIS — M9901 Segmental and somatic dysfunction of cervical region: Secondary | ICD-10-CM | POA: Diagnosis not present

## 2023-12-04 DIAGNOSIS — M9901 Segmental and somatic dysfunction of cervical region: Secondary | ICD-10-CM | POA: Diagnosis not present

## 2023-12-04 DIAGNOSIS — M542 Cervicalgia: Secondary | ICD-10-CM | POA: Diagnosis not present

## 2023-12-04 DIAGNOSIS — M9903 Segmental and somatic dysfunction of lumbar region: Secondary | ICD-10-CM | POA: Diagnosis not present

## 2023-12-04 DIAGNOSIS — M545 Low back pain, unspecified: Secondary | ICD-10-CM | POA: Diagnosis not present

## 2024-01-26 DIAGNOSIS — H52203 Unspecified astigmatism, bilateral: Secondary | ICD-10-CM | POA: Diagnosis not present

## 2024-01-26 DIAGNOSIS — H5213 Myopia, bilateral: Secondary | ICD-10-CM | POA: Diagnosis not present

## 2024-01-26 DIAGNOSIS — H31002 Unspecified chorioretinal scars, left eye: Secondary | ICD-10-CM | POA: Diagnosis not present

## 2024-02-04 DIAGNOSIS — D225 Melanocytic nevi of trunk: Secondary | ICD-10-CM | POA: Diagnosis not present

## 2024-02-04 DIAGNOSIS — L821 Other seborrheic keratosis: Secondary | ICD-10-CM | POA: Diagnosis not present

## 2024-02-04 DIAGNOSIS — X32XXXA Exposure to sunlight, initial encounter: Secondary | ICD-10-CM | POA: Diagnosis not present

## 2024-02-04 DIAGNOSIS — L814 Other melanin hyperpigmentation: Secondary | ICD-10-CM | POA: Diagnosis not present

## 2024-02-04 DIAGNOSIS — L570 Actinic keratosis: Secondary | ICD-10-CM | POA: Diagnosis not present

## 2024-02-04 DIAGNOSIS — L301 Dyshidrosis [pompholyx]: Secondary | ICD-10-CM | POA: Diagnosis not present

## 2024-05-07 IMAGING — DX DG ABDOMEN ACUTE W/ 1V CHEST
3 series · 3 of 3 positions shown · non-contrast
Comparison: None Available.

CLINICAL DATA: Left upper quadrant abdominal pain status post
colonoscopy.

EXAM:
DG ABDOMEN ACUTE WITH 1 VIEW CHEST

[chest ap grid]
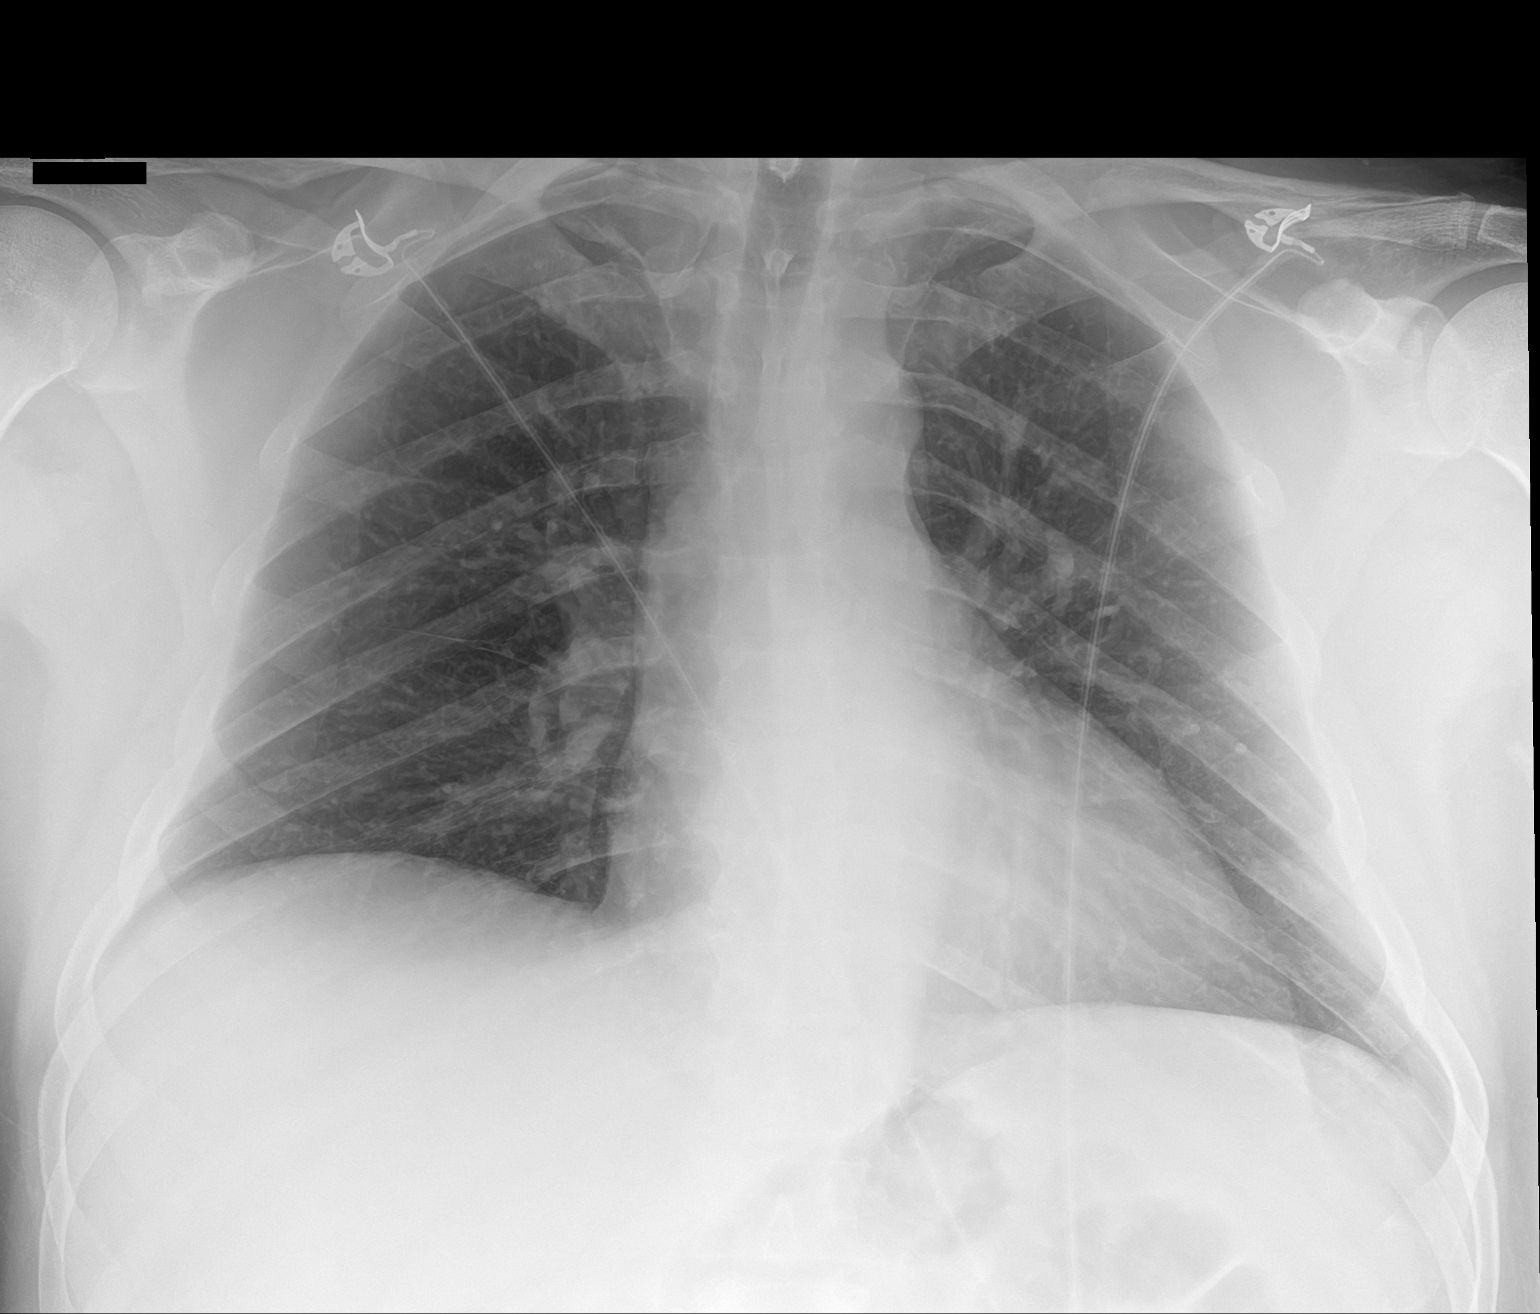

[abdomen erect grid]
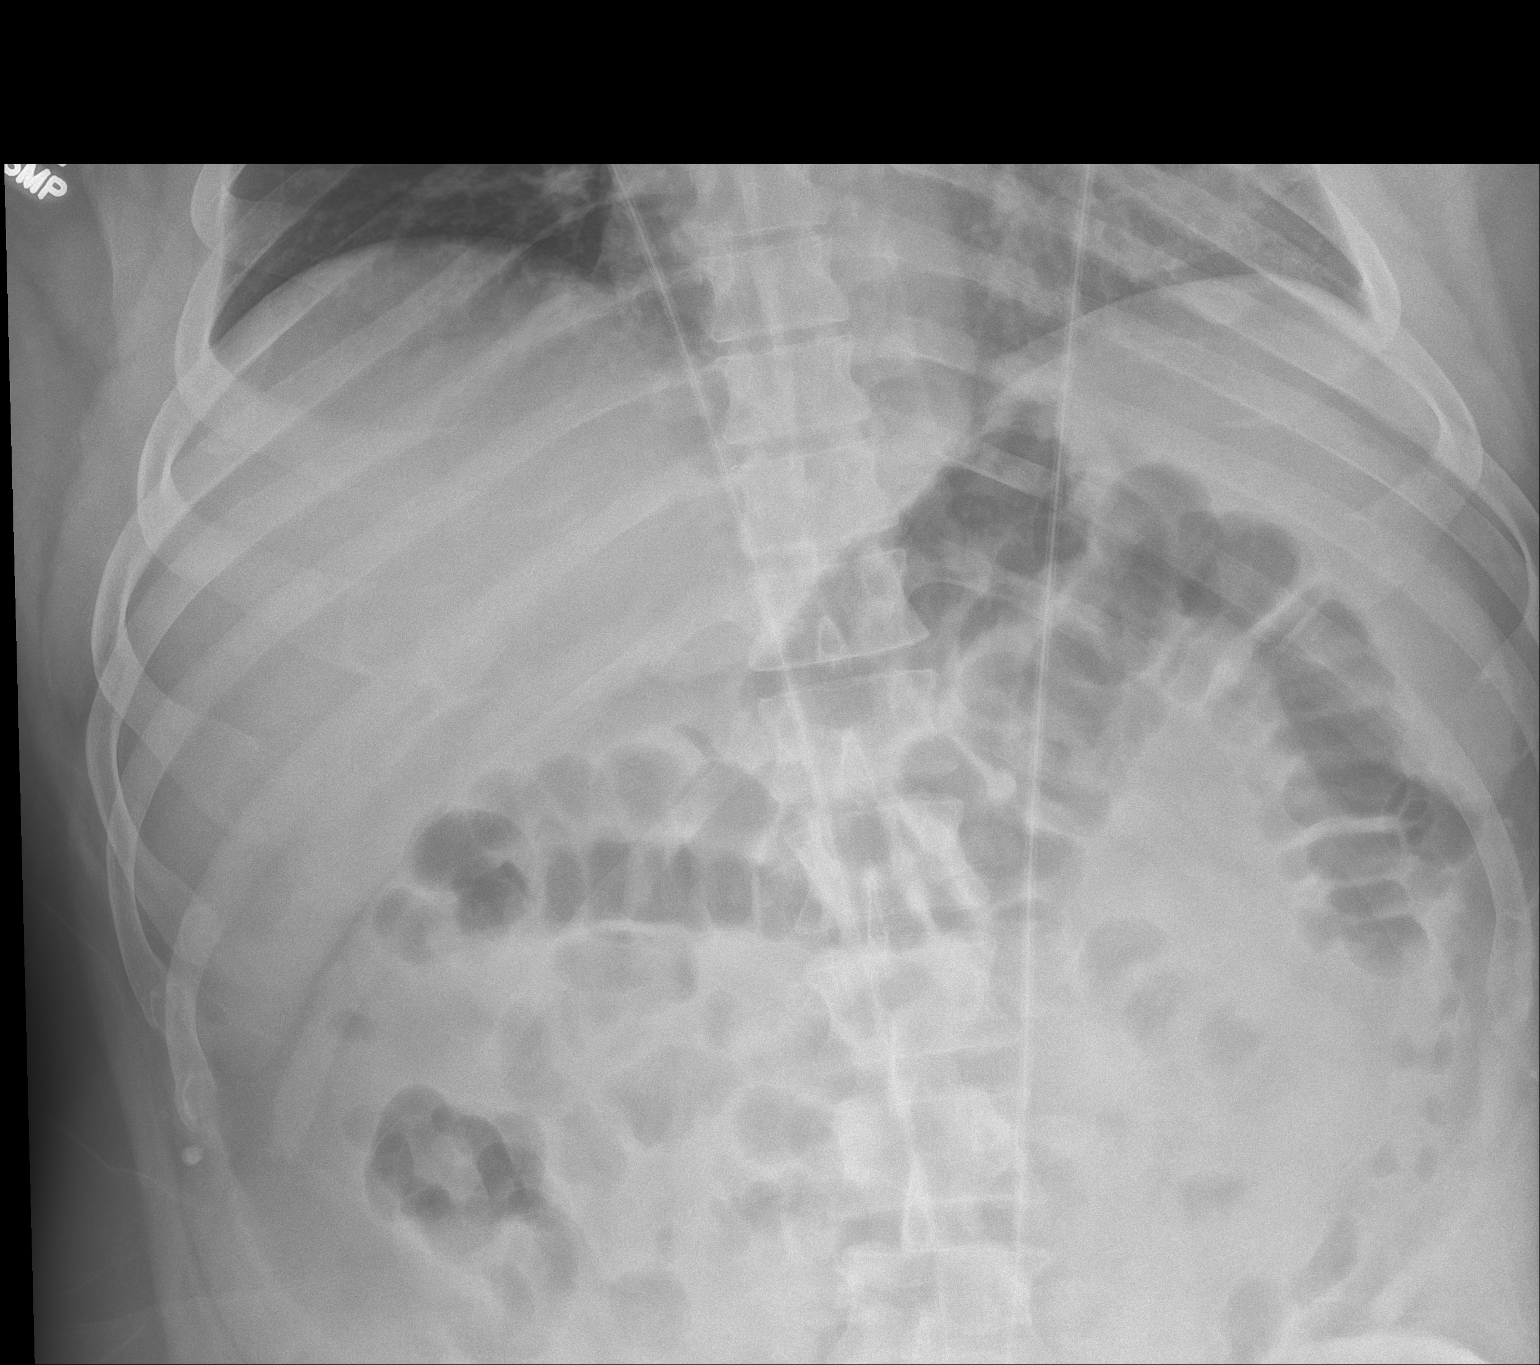

[abdomen supine grid]
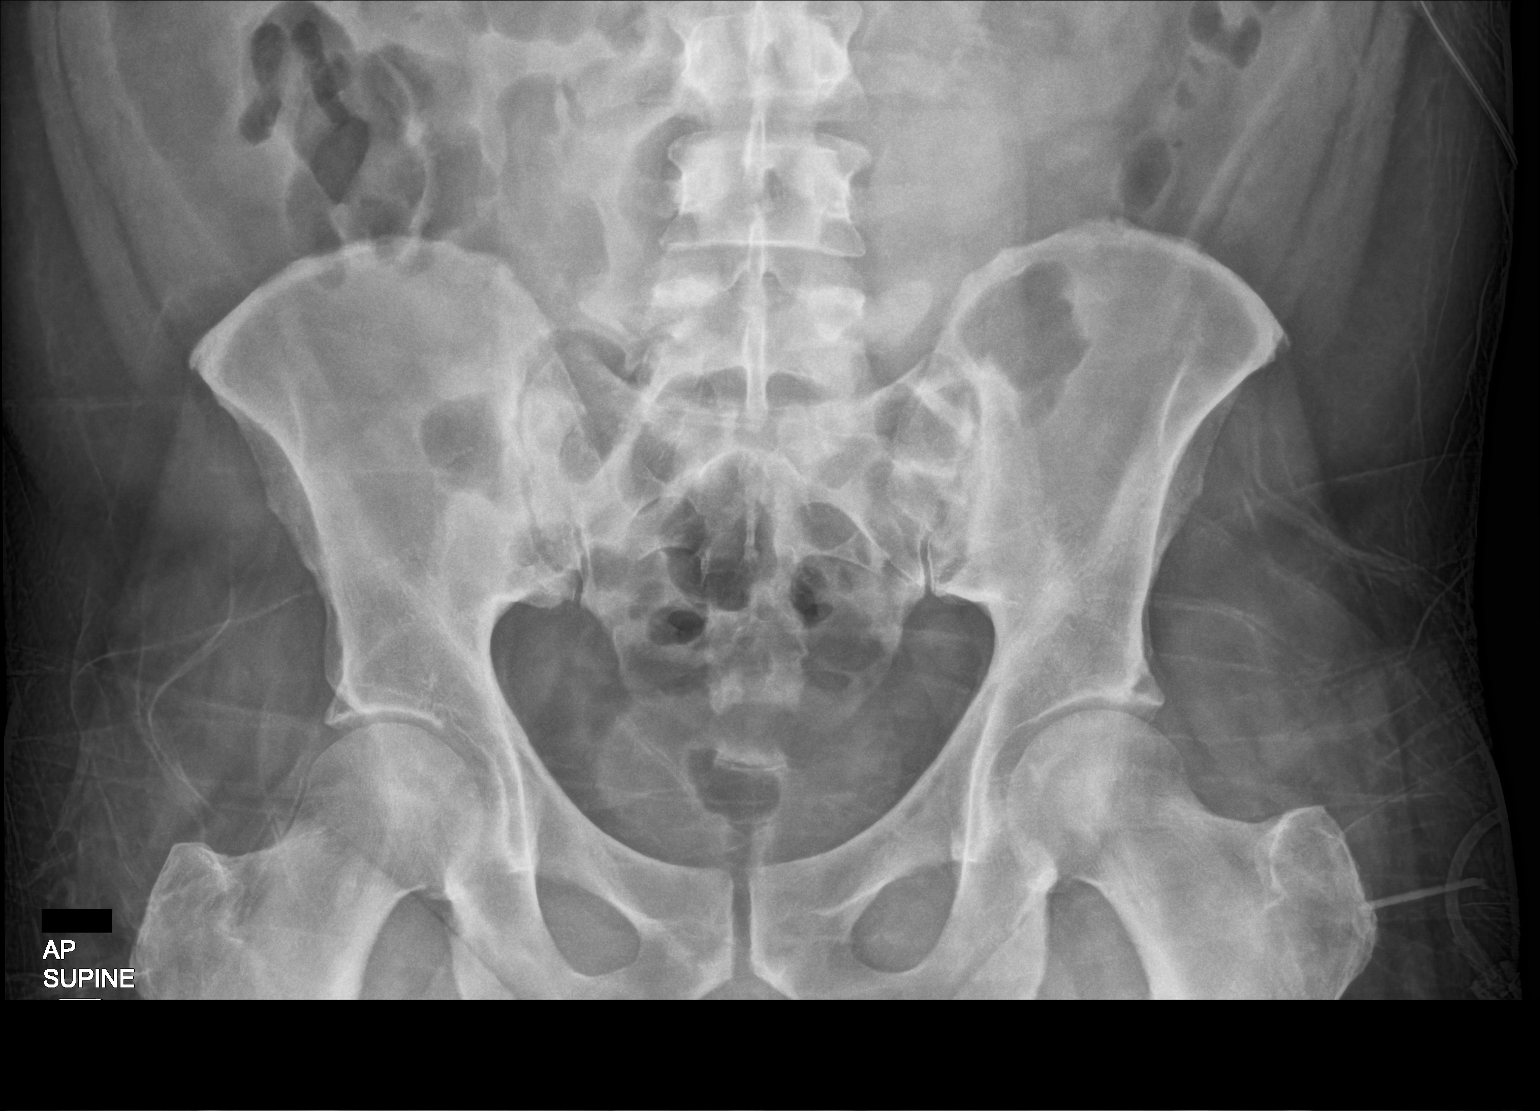

[3 of 3 positions shown; findings below may reference images not displayed]

FINDINGS: There is no evidence of dilated bowel loops or free intraperitoneal
air. No radiopaque calculi or other significant radiographic
abnormality is seen. Heart size and mediastinal contours are within
normal limits. Both lungs are clear.
IMPRESSION: Negative abdominal radiographs.  No acute cardiopulmonary disease.

## 2024-05-09 IMAGING — DX DG CHEST 1V PORT
1 series · 1 of 1 positions shown · non-contrast
Comparison: Acute abdominal series 01/04/2022.

CLINICAL DATA: 50-year-old male with chest pain radiating to the
back. Left chest rash. Status post recent colonoscopy.

EXAM:
PORTABLE CHEST 1 VIEW

[chest ap]
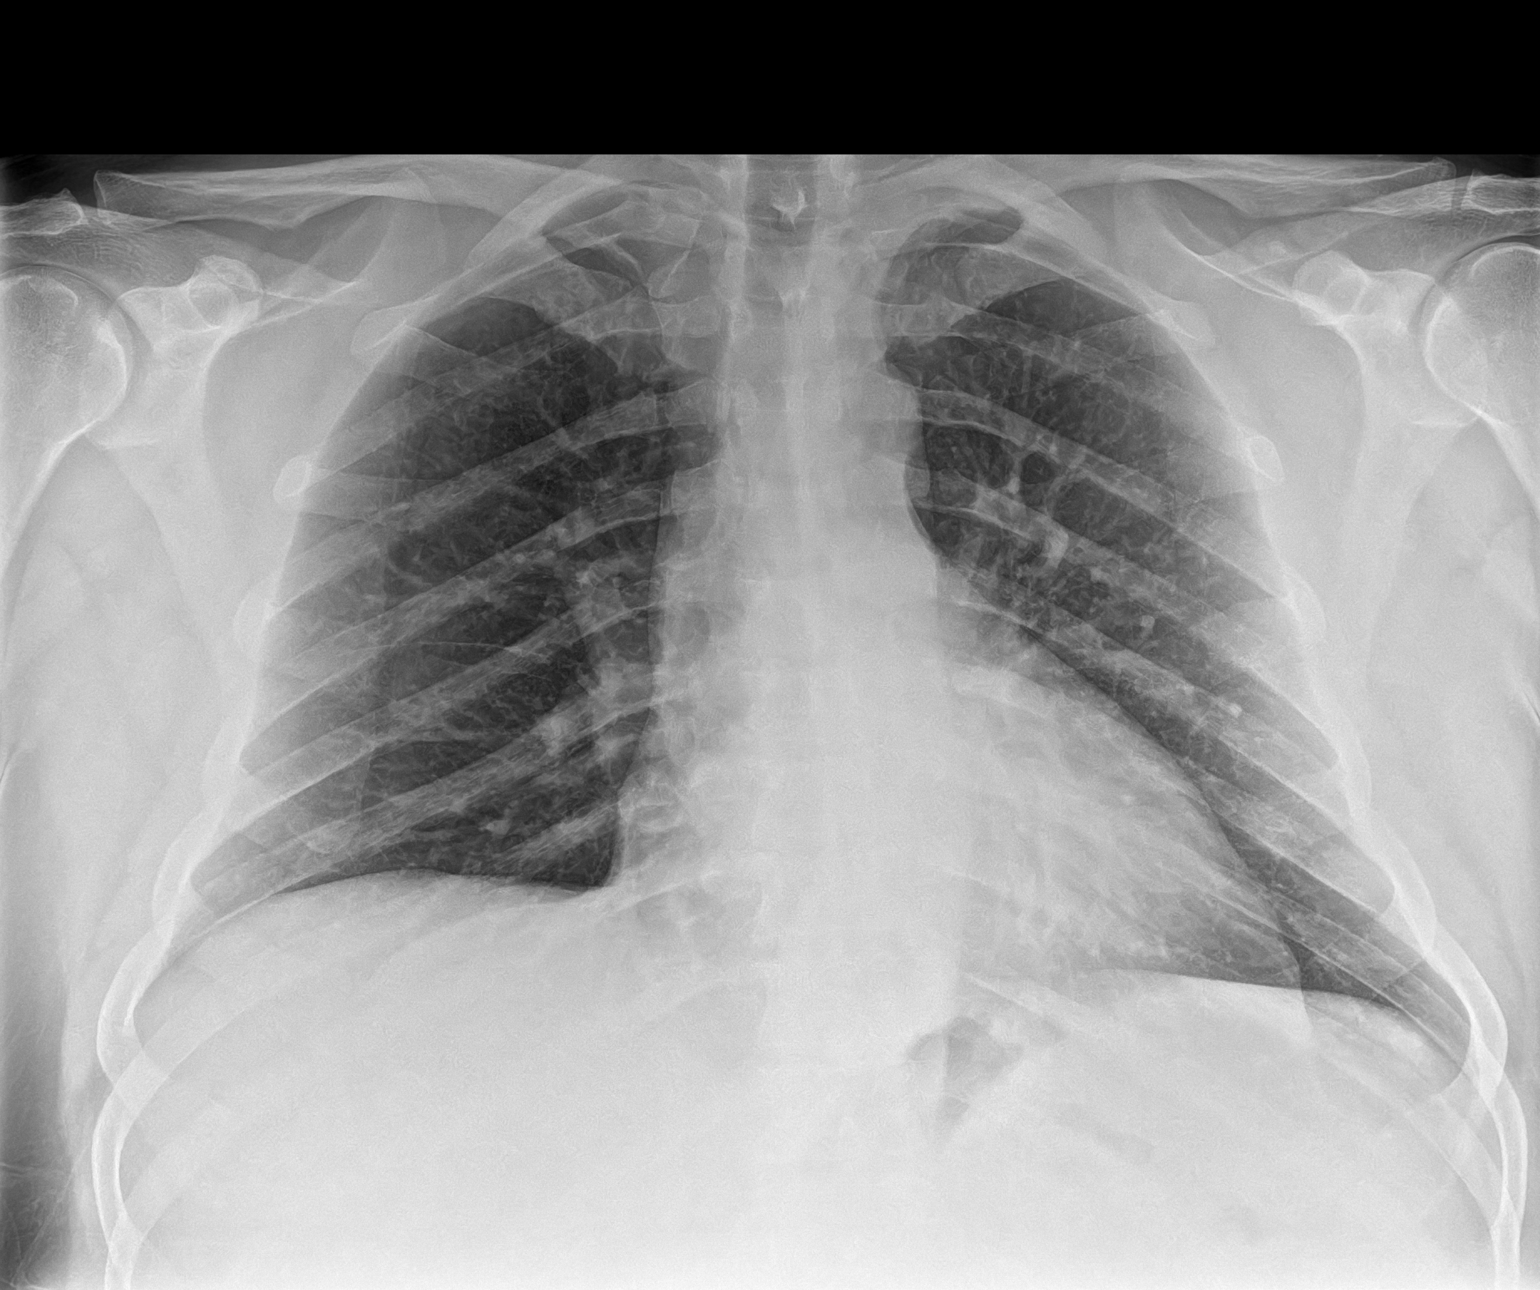

[1 of 1 positions shown; findings below may reference images not displayed]

FINDINGS: Portable AP view at 7449 hours. Lung volumes and mediastinal
contours remain normal. Visualized tracheal air column is within
normal limits. Allowing for portable technique the lungs are clear.
No pneumothorax or pneumoperitoneum. Paucity of bowel gas in the
visible upper abdomen. No acute osseous abnormality identified.
IMPRESSION: Negative portable chest.

## 2024-05-10 ENCOUNTER — Other Ambulatory Visit: Payer: Self-pay | Admitting: *Deleted

## 2024-05-10 MED ORDER — LEVETIRACETAM ER 500 MG PO TB24: 1500.0000 mg | ORAL_TABLET | Freq: Every day | ORAL | 0 refills | Status: DC

## 2024-05-10 NOTE — Telephone Encounter (Signed)
 Last seen on 05/05/23 Follow up scheduled on 05/18/24

## 2024-05-17 NOTE — Patient Instructions (Incomplete)
 Below is our plan:  We will continue levetiracetam  XR 1500mg  daily. Keep an eye on your blood pressure at home. Follow up closely with PCP.    Please make sure you are consistent with timing of seizure medication. I recommend annual visit with primary care provider (PCP) for complete physical and routine blood work. I recommend daily intake of vitamin D (400-800iu) and calcium (800-1000mg ) for bone health. Discuss Dexa screening with PCP.   According to [place] law, you can not drive unless you are seizure / syncope free for at least 6 months and under physician's care.  Please maintain precautions. Do not participate in activities where a loss of awareness could harm you or someone else. No swimming alone, no tub bathing, no hot tubs, no driving, no operating motorized vehicles (cars, ATVs, motocycles, etc), lawnmowers, power tools or firearms. No standing at heights, such as rooftops, ladders or stairs. Avoid hot objects such as stoves, heaters, open fires. Wear a helmet when riding a bicycle, scooter, skateboard, etc. and avoid areas of traffic. Set your water heater to 120 degrees or less.  SUDEP is the sudden, unexpected death of someone with epilepsy, who was otherwise healthy. In SUDEP cases, no other cause of death is found when an autopsy is done. Each year, more than 1 in 1,000 people with epilepsy die from SUDEP. This is the leading cause of death in people with uncontrolled seizures. Until further answers are available, the best way to prevent SUDEP is to lower your risk by controlling seizures. Research has found that people with all types of epilepsy that experience convulsive seizures can be at risk.  Please make sure you are staying well hydrated. I recommend 50-60 ounces daily. Well balanced diet and regular exercise encouraged. Consistent sleep schedule with 6-8 hours recommended.   Please continue follow up with care team as directed.   Follow up with me in 1 year   You may receive a  survey regarding today's visit. I encourage you to leave honest feed back as I do use this information to improve patient care. Thank you for seeing me today!
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
## 2024-05-18 ENCOUNTER — Encounter: Payer: Self-pay | Admitting: Family Medicine

## 2024-05-18 ENCOUNTER — Ambulatory Visit: Payer: BC Managed Care – PPO | Admitting: Family Medicine

## 2024-05-18 VITALS — BP 140/106 | HR 100 | Ht 70.0 in | Wt 228.8 lb

## 2024-05-18 DIAGNOSIS — G40B09 Juvenile myoclonic epilepsy, not intractable, without status epilepticus: Secondary | ICD-10-CM | POA: Diagnosis not present

## 2024-05-18 MED ORDER — LEVETIRACETAM ER 500 MG PO TB24: 1500.0000 mg | ORAL_TABLET | Freq: Every day | ORAL | 3 refills | Status: AC

## 2024-06-08 DIAGNOSIS — Z0001 Encounter for general adult medical examination with abnormal findings: Secondary | ICD-10-CM | POA: Diagnosis not present

## 2024-06-08 DIAGNOSIS — I10 Essential (primary) hypertension: Secondary | ICD-10-CM | POA: Diagnosis not present

## 2024-07-05 ENCOUNTER — Other Ambulatory Visit (HOSPITAL_BASED_OUTPATIENT_CLINIC_OR_DEPARTMENT_OTHER): Payer: Self-pay | Admitting: Family Medicine

## 2024-07-05 DIAGNOSIS — Z8249 Family history of ischemic heart disease and other diseases of the circulatory system: Secondary | ICD-10-CM

## 2024-07-14 ENCOUNTER — Ambulatory Visit (HOSPITAL_BASED_OUTPATIENT_CLINIC_OR_DEPARTMENT_OTHER)
Admission: RE | Admit: 2024-07-14 | Discharge: 2024-07-14 | Disposition: A | Discharge status: Home / Self Care | Payer: Self-pay | Source: Ambulatory Visit | Attending: Family Medicine | Admitting: Family Medicine

## 2024-07-14 DIAGNOSIS — Z8249 Family history of ischemic heart disease and other diseases of the circulatory system: Secondary | ICD-10-CM | POA: Insufficient documentation

## 2024-07-19 DIAGNOSIS — I10 Essential (primary) hypertension: Secondary | ICD-10-CM | POA: Diagnosis not present

## 2024-07-19 DIAGNOSIS — E66811 Obesity, class 1: Secondary | ICD-10-CM | POA: Diagnosis not present

## 2024-07-19 DIAGNOSIS — E782 Mixed hyperlipidemia: Secondary | ICD-10-CM | POA: Diagnosis not present

## 2025-05-31 ENCOUNTER — Telehealth: Admitting: Family Medicine
